# Patient Record
Sex: Female | Born: 1950
Health system: Southern US, Community
[De-identification: ages and names within clinical notes are randomized; demographics above are authoritative.]

## PROBLEM LIST (undated history)

## (undated) DIAGNOSIS — E11319 Type 2 diabetes mellitus with unspecified diabetic retinopathy without macular edema: Secondary | ICD-10-CM

## (undated) DIAGNOSIS — M1990 Unspecified osteoarthritis, unspecified site: Secondary | ICD-10-CM

## (undated) DIAGNOSIS — M719 Bursopathy, unspecified: Secondary | ICD-10-CM

## (undated) DIAGNOSIS — K219 Gastro-esophageal reflux disease without esophagitis: Secondary | ICD-10-CM

## (undated) DIAGNOSIS — J40 Bronchitis, not specified as acute or chronic: Secondary | ICD-10-CM

## (undated) DIAGNOSIS — I10 Essential (primary) hypertension: Secondary | ICD-10-CM

## (undated) HISTORY — PX: TOOTH EXTRACTION: SUR596

## (undated) HISTORY — PX: ABDOMINAL HYSTERECTOMY: SHX81

---

## 1999-08-28 ENCOUNTER — Emergency Department (HOSPITAL_COMMUNITY): Admission: EM | Admit: 1999-08-28 | Discharge: 1999-08-28 | Payer: Self-pay | Admitting: Emergency Medicine

## 1999-08-28 ENCOUNTER — Encounter: Payer: Self-pay | Admitting: Emergency Medicine

## 2000-08-09 ENCOUNTER — Emergency Department (HOSPITAL_COMMUNITY): Admission: EM | Admit: 2000-08-09 | Discharge: 2000-08-09 | Payer: Self-pay | Admitting: Emergency Medicine

## 2001-08-10 ENCOUNTER — Emergency Department (HOSPITAL_COMMUNITY): Admission: EM | Admit: 2001-08-10 | Discharge: 2001-08-10 | Payer: Self-pay | Admitting: Emergency Medicine

## 2001-08-10 ENCOUNTER — Encounter: Payer: Self-pay | Admitting: Emergency Medicine

## 2001-12-25 ENCOUNTER — Encounter: Payer: Self-pay | Admitting: Emergency Medicine

## 2001-12-25 ENCOUNTER — Emergency Department (HOSPITAL_COMMUNITY): Admission: EM | Admit: 2001-12-25 | Discharge: 2001-12-25 | Payer: Self-pay | Admitting: Emergency Medicine

## 2002-01-06 ENCOUNTER — Emergency Department (HOSPITAL_COMMUNITY): Admission: EM | Admit: 2002-01-06 | Discharge: 2002-01-06 | Payer: Self-pay | Admitting: *Deleted

## 2003-08-13 ENCOUNTER — Emergency Department (HOSPITAL_COMMUNITY): Admission: EM | Admit: 2003-08-13 | Discharge: 2003-08-13 | Payer: Self-pay | Admitting: *Deleted

## 2003-08-17 ENCOUNTER — Emergency Department (HOSPITAL_COMMUNITY): Admission: EM | Admit: 2003-08-17 | Discharge: 2003-08-17 | Payer: Self-pay | Admitting: Emergency Medicine

## 2005-05-18 ENCOUNTER — Emergency Department (HOSPITAL_COMMUNITY): Admission: EM | Admit: 2005-05-18 | Discharge: 2005-05-18 | Payer: Self-pay | Admitting: *Deleted

## 2005-10-26 ENCOUNTER — Emergency Department (HOSPITAL_COMMUNITY): Admission: EM | Admit: 2005-10-26 | Discharge: 2005-10-26 | Payer: Self-pay | Admitting: Emergency Medicine

## 2006-08-17 ENCOUNTER — Ambulatory Visit: Payer: Self-pay | Admitting: Family Medicine

## 2006-08-18 ENCOUNTER — Ambulatory Visit: Payer: Self-pay | Admitting: *Deleted

## 2006-08-30 ENCOUNTER — Emergency Department (HOSPITAL_COMMUNITY): Admission: EM | Admit: 2006-08-30 | Discharge: 2006-08-31 | Payer: Self-pay | Admitting: Emergency Medicine

## 2007-03-01 ENCOUNTER — Ambulatory Visit: Payer: Self-pay | Admitting: Family Medicine

## 2007-03-01 ENCOUNTER — Encounter (INDEPENDENT_AMBULATORY_CARE_PROVIDER_SITE_OTHER): Payer: Self-pay | Admitting: Family Medicine

## 2007-03-01 LAB — CONVERTED CEMR LAB: Urinalysis: ABNORMAL

## 2007-03-21 ENCOUNTER — Encounter (INDEPENDENT_AMBULATORY_CARE_PROVIDER_SITE_OTHER): Payer: Self-pay | Admitting: Family Medicine

## 2007-03-21 DIAGNOSIS — N951 Menopausal and female climacteric states: Secondary | ICD-10-CM

## 2007-03-21 DIAGNOSIS — J45909 Unspecified asthma, uncomplicated: Secondary | ICD-10-CM

## 2007-03-21 DIAGNOSIS — K219 Gastro-esophageal reflux disease without esophagitis: Secondary | ICD-10-CM

## 2007-03-30 ENCOUNTER — Encounter (INDEPENDENT_AMBULATORY_CARE_PROVIDER_SITE_OTHER): Payer: Self-pay | Admitting: *Deleted

## 2007-04-13 ENCOUNTER — Ambulatory Visit: Payer: Self-pay | Admitting: Family Medicine

## 2007-05-02 DIAGNOSIS — F17200 Nicotine dependence, unspecified, uncomplicated: Secondary | ICD-10-CM | POA: Insufficient documentation

## 2007-05-05 ENCOUNTER — Ambulatory Visit: Payer: Self-pay | Admitting: Family Medicine

## 2007-11-01 ENCOUNTER — Ambulatory Visit: Payer: Self-pay | Admitting: Internal Medicine

## 2008-02-04 ENCOUNTER — Emergency Department (HOSPITAL_COMMUNITY): Admission: EM | Admit: 2008-02-04 | Discharge: 2008-02-04 | Payer: Self-pay | Admitting: Emergency Medicine

## 2008-04-04 ENCOUNTER — Encounter (INDEPENDENT_AMBULATORY_CARE_PROVIDER_SITE_OTHER): Payer: Self-pay | Admitting: Family Medicine

## 2008-04-04 ENCOUNTER — Ambulatory Visit: Payer: Self-pay | Admitting: Internal Medicine

## 2008-04-04 LAB — CONVERTED CEMR LAB
ALT: 41 units/L — ABNORMAL HIGH (ref 0–35)
Alkaline Phosphatase: 65 units/L (ref 39–117)
Basophils Absolute: 0 10*3/uL (ref 0.0–0.1)
Basophils Relative: 0 % (ref 0–1)
CO2: 24 meq/L (ref 19–32)
Cholesterol: 151 mg/dL (ref 0–200)
Creatinine, Ser: 0.86 mg/dL (ref 0.40–1.20)
Eosinophils Absolute: 0.1 10*3/uL (ref 0.0–0.7)
Eosinophils Relative: 2 % (ref 0–5)
HCT: 41.8 % (ref 36.0–46.0)
Hemoglobin: 13.8 g/dL (ref 12.0–15.0)
Lymphocytes Relative: 38 % (ref 12–46)
MCHC: 33 g/dL (ref 30.0–36.0)
MCV: 83.8 fL (ref 78.0–100.0)
Microalb, Ur: 0.45 mg/dL (ref 0.00–1.89)
Monocytes Absolute: 0.6 10*3/uL (ref 0.1–1.0)
RDW: 17.5 % — ABNORMAL HIGH (ref 11.5–15.5)
Total Bilirubin: 0.4 mg/dL (ref 0.3–1.2)
Total CHOL/HDL Ratio: 2.5
Triglycerides: 73 mg/dL (ref <150)
VLDL: 15 mg/dL (ref 0–40)

## 2008-10-31 ENCOUNTER — Ambulatory Visit: Payer: Self-pay | Admitting: Family Medicine

## 2009-05-13 ENCOUNTER — Ambulatory Visit: Payer: Self-pay | Admitting: Internal Medicine

## 2009-05-14 ENCOUNTER — Encounter (INDEPENDENT_AMBULATORY_CARE_PROVIDER_SITE_OTHER): Payer: Self-pay | Admitting: Internal Medicine

## 2009-05-14 LAB — CONVERTED CEMR LAB
CO2: 25 meq/L (ref 19–32)
Cholesterol: 160 mg/dL (ref 0–200)
Creatinine, Ser: 0.96 mg/dL (ref 0.40–1.20)
Glucose, Bld: 96 mg/dL (ref 70–99)
Total Bilirubin: 0.5 mg/dL (ref 0.3–1.2)
Total CHOL/HDL Ratio: 2.7
Triglycerides: 104 mg/dL (ref <150)
VLDL: 21 mg/dL (ref 0–40)

## 2009-07-15 ENCOUNTER — Ambulatory Visit: Payer: Self-pay | Admitting: Internal Medicine

## 2009-07-15 ENCOUNTER — Telehealth (INDEPENDENT_AMBULATORY_CARE_PROVIDER_SITE_OTHER): Payer: Self-pay | Admitting: *Deleted

## 2010-08-12 NOTE — Progress Notes (Signed)
Summary: triage/cough congestion hx asthma and bronchitis  Phone Note Call from Patient   Reason for Call: Talk to Nurse Summary of Call: Patient states she has been sick since Christmas with coughing and congestion in chest..Marland KitchenDescribes as tightness. Cough is non productive. She has a history of Asthma and Bronchitis.Marland KitchenMarland KitchenShe is a smoker.Marland KitchenMarland KitchenShe also has a sore throat with "chills and sweat rolling off".  Patient has tried different OTC medications and nothing is helping... Initial call taken by: Conchita Paris,  July 15, 2009 9:06 AM

## 2010-09-09 ENCOUNTER — Emergency Department (HOSPITAL_COMMUNITY)
Admission: EM | Admit: 2010-09-09 | Discharge: 2010-09-09 | Disposition: A | Discharge status: Home / Self Care | Payer: Self-pay | Attending: Emergency Medicine | Admitting: Emergency Medicine

## 2010-09-09 ENCOUNTER — Emergency Department (HOSPITAL_COMMUNITY): Payer: Self-pay

## 2010-09-09 DIAGNOSIS — R197 Diarrhea, unspecified: Secondary | ICD-10-CM | POA: Insufficient documentation

## 2010-09-09 DIAGNOSIS — N390 Urinary tract infection, site not specified: Secondary | ICD-10-CM | POA: Insufficient documentation

## 2010-09-09 DIAGNOSIS — R109 Unspecified abdominal pain: Secondary | ICD-10-CM | POA: Insufficient documentation

## 2010-09-09 DIAGNOSIS — R112 Nausea with vomiting, unspecified: Secondary | ICD-10-CM | POA: Insufficient documentation

## 2010-09-09 DIAGNOSIS — R509 Fever, unspecified: Secondary | ICD-10-CM | POA: Insufficient documentation

## 2010-09-09 LAB — URINE MICROSCOPIC-ADD ON

## 2010-09-09 LAB — COMPREHENSIVE METABOLIC PANEL
ALT: 30 U/L (ref 0–35)
AST: 27 U/L (ref 0–37)
Albumin: 4.4 g/dL (ref 3.5–5.2)
Alkaline Phosphatase: 66 U/L (ref 39–117)
CO2: 29 mEq/L (ref 19–32)
Chloride: 103 mEq/L (ref 96–112)
Creatinine, Ser: 0.74 mg/dL (ref 0.4–1.2)
GFR calc Af Amer: >60 mL/min (ref >60)
GFR calc non Af Amer: >60 mL/min (ref >60)
Potassium: 4.1 mEq/L (ref 3.5–5.1)
Total Bilirubin: 0.8 mg/dL (ref 0.3–1.2)

## 2010-09-09 LAB — URINALYSIS, ROUTINE W REFLEX MICROSCOPIC
Hgb urine dipstick: NEGATIVE
Ketones, ur: NEGATIVE mg/dL
Protein, ur: NEGATIVE mg/dL
Urine Glucose, Fasting: NEGATIVE mg/dL

## 2010-09-09 LAB — DIFFERENTIAL
Basophils Absolute: 0 10*3/uL (ref 0.0–0.1)
Eosinophils Absolute: 0 10*3/uL (ref 0.0–0.7)
Eosinophils Relative: 1 % (ref 0–5)
Lymphocytes Relative: 25 % (ref 12–46)
Monocytes Absolute: 0.5 10*3/uL (ref 0.1–1.0)

## 2010-09-09 LAB — CBC
Hemoglobin: 14.9 g/dL (ref 12.0–15.0)
MCH: 27.2 pg (ref 26.0–34.0)
Platelets: 255 10*3/uL (ref 150–400)
RBC: 5.47 MIL/uL — ABNORMAL HIGH (ref 3.87–5.11)
WBC: 6.4 10*3/uL (ref 4.0–10.5)

## 2010-09-09 MED ORDER — IOHEXOL 300 MG/ML  SOLN
100.0000 mL | Freq: Once | INTRAMUSCULAR | Status: AC | PRN
  Administered 2010-09-09: 100 mL via INTRAVENOUS

## 2011-01-19 ENCOUNTER — Emergency Department (HOSPITAL_COMMUNITY): Payer: Self-pay

## 2011-01-19 ENCOUNTER — Emergency Department (HOSPITAL_COMMUNITY)
Admission: EM | Admit: 2011-01-19 | Discharge: 2011-01-19 | Disposition: A | Discharge status: Home / Self Care | Payer: Self-pay | Attending: Emergency Medicine | Admitting: Emergency Medicine

## 2011-01-19 DIAGNOSIS — M67919 Unspecified disorder of synovium and tendon, unspecified shoulder: Secondary | ICD-10-CM | POA: Insufficient documentation

## 2011-01-19 DIAGNOSIS — M25519 Pain in unspecified shoulder: Secondary | ICD-10-CM | POA: Insufficient documentation

## 2011-01-19 DIAGNOSIS — M719 Bursopathy, unspecified: Secondary | ICD-10-CM | POA: Insufficient documentation

## 2011-04-10 LAB — HEPATITIS C ANTIBODY, REFLEX: HCV Ab: NEGATIVE

## 2011-09-22 ENCOUNTER — Encounter (HOSPITAL_COMMUNITY): Payer: Self-pay | Admitting: Emergency Medicine

## 2011-09-22 ENCOUNTER — Emergency Department (INDEPENDENT_AMBULATORY_CARE_PROVIDER_SITE_OTHER)
Admission: EM | Admit: 2011-09-22 | Discharge: 2011-09-22 | Disposition: A | Discharge status: Home / Self Care | Payer: Self-pay | Source: Home / Self Care | Attending: Emergency Medicine | Admitting: Emergency Medicine

## 2011-09-22 DIAGNOSIS — J45909 Unspecified asthma, uncomplicated: Secondary | ICD-10-CM

## 2011-09-22 HISTORY — DX: Bronchitis, not specified as acute or chronic: J40

## 2011-09-22 HISTORY — DX: Unspecified osteoarthritis, unspecified site: M19.90

## 2011-09-22 HISTORY — DX: Bursopathy, unspecified: M71.9

## 2011-09-22 MED ORDER — IPRATROPIUM BROMIDE 0.02 % IN SOLN
0.5000 mg | Freq: Once | RESPIRATORY_TRACT | Status: AC
  Administered 2011-09-22: 0.5 mg via RESPIRATORY_TRACT

## 2011-09-22 MED ORDER — PREDNISONE 5 MG PO KIT: 1.0000 | PACK | Freq: Every day | ORAL | Status: DC

## 2011-09-22 MED ORDER — ALBUTEROL SULFATE (5 MG/ML) 0.5% IN NEBU
INHALATION_SOLUTION | RESPIRATORY_TRACT | Status: AC
  Filled 2011-09-22: qty 1

## 2011-09-22 MED ORDER — AMOXICILLIN 500 MG PO CAPS: 1000.0000 mg | ORAL_CAPSULE | Freq: Three times a day (TID) | ORAL | Status: AC

## 2011-09-22 MED ORDER — METHYLPREDNISOLONE SODIUM SUCC 125 MG IJ SOLR
125.0000 mg | Freq: Once | INTRAMUSCULAR | Status: AC
  Administered 2011-09-22: 125 mg via INTRAMUSCULAR

## 2011-09-22 MED ORDER — BENZONATATE 200 MG PO CAPS: 200.0000 mg | ORAL_CAPSULE | Freq: Three times a day (TID) | ORAL | Status: AC | PRN

## 2011-09-22 MED ORDER — METHYLPREDNISOLONE SODIUM SUCC 125 MG IJ SOLR
INTRAMUSCULAR | Status: AC
  Filled 2011-09-22: qty 2

## 2011-09-22 MED ORDER — ALBUTEROL SULFATE (5 MG/ML) 0.5% IN NEBU
5.0000 mg | INHALATION_SOLUTION | Freq: Once | RESPIRATORY_TRACT | Status: AC
  Administered 2011-09-22: 5 mg via RESPIRATORY_TRACT

## 2011-09-22 NOTE — ED Provider Notes (Signed)
Chief Complaint  Patient presents with  . URI    History of Present Illness:   Barbara Wilcox is a 61 year old female who has had a 6 day history of a dry cough, soreness in chest, wheezing, aching all over, feels weak, tired, and rundown, has felt chilled and has had sweats, and sore throat, nasal congestion, rhinorrhea, sneezing, and occasional nausea, vomiting, and diarrhea. She has a history of asthma and she takes when necessary Proventil for that. She is also cigarette smoker. [facility].  Review of Systems:  Other than noted above, the patient denies any of the following symptoms. Systemic:  No fever, chills, sweats, fatigue, myalgias, headache, or anorexia. Eye:  No redness, pain or drainage. ENT:  No earache, nasal congestion, rhinorrhea, sinus pressure, or sore throat. Lungs:  No cough, sputum production, wheezing, shortness of breath. Or chest pain. GI:  No nausea, vomiting, abdominal pain or diarrhea. Skin:  No rash or itching.  PMFSH:  Past medical history, family history, social history, meds, and allergies were reviewed.  Physical Exam:   Vital signs:  BP 142/77  Pulse 92  Temp(Src) 98.3 F (36.8 C) (Oral)  Resp 20  SpO2 100% General:  Alert, in no distress. Eye:  No conjunctival injection or drainage. ENT:  TMs and canals were normal, without erythema or inflammation.  Nasal mucosa was clear and uncongested, without drainage.  Mucous membranes were moist.  Pharynx was clear, without exudate or drainage.  There were no oral ulcerations or lesions. Neck:  Supple, no adenopathy, tenderness or mass. Lungs:  No respiratory distress.  She has bilateral inspiratory and expiratory wheezes and rhonchi, no rales.  Breath sounds were otherwise clear and equal bilaterally. Heart:  Regular rhythm, without gallops, murmers or rubs. Skin:  Clear, warm, and dry, without rash or lesions.  Course in Urgent Care Center:   She was given a DuoNeb  breathing treatment and Solu-Medrol 125 mg IM. Thereafter she felt considerably better. On auscultation her lungs were clear and wheeze free.   Assessment:   Diagnoses that have been ruled out:  None  Diagnoses that are still under consideration:  None  Final diagnoses:  Asthmatic bronchitis      Plan:   1.  The following meds were prescribed:   New Prescriptions   AMOXICILLIN (AMOXIL) 500 MG CAPSULE    Take 2 capsules (1,000 mg total) by mouth 3 (three) times daily.   BENZONATATE (TESSALON) 200 MG CAPSULE    Take 1 capsule (200 mg total) by mouth 3 (three) times daily as needed for cough.   PREDNISONE 5 MG KIT    Take 1 kit (5 mg total) by mouth daily after breakfast. Prednisone 5 mg 6 day dosepack.  Take as directed.   2.  The patient was instructed in symptomatic care and handouts were given. 3.  The patient was told to return if becoming worse in any way, if no better in 3 or 4 days, and given some red flag symptoms that would indicate earlier return.  She was strongly encouraged to quit smoking.   Reuben Likes, MD 09/22/11 [phone]

## 2011-09-22 NOTE — Discharge Instructions (Signed)

## 2011-09-22 NOTE — ED Notes (Signed)
Onset of symptoms on Thursday: sore throat, and feeling bad, getting worse every day.  Patient has a cough, hard cough, non productive.  Unknown fever.  Audible wheezing, patient is speaking in complete sentences, coughing frequently.  As patient coughing subsides, wheezing is less audible.  Also has runny nose, denies ear pain.  Right ear stuffy at times

## 2012-02-05 ENCOUNTER — Emergency Department (HOSPITAL_COMMUNITY): Payer: Self-pay

## 2012-02-05 ENCOUNTER — Emergency Department (HOSPITAL_COMMUNITY)
Admission: EM | Admit: 2012-02-05 | Discharge: 2012-02-05 | Disposition: A | Discharge status: Home / Self Care | Payer: Self-pay | Attending: Emergency Medicine | Admitting: Emergency Medicine

## 2012-02-05 ENCOUNTER — Encounter (HOSPITAL_COMMUNITY): Payer: Self-pay | Admitting: *Deleted

## 2012-02-05 DIAGNOSIS — N390 Urinary tract infection, site not specified: Secondary | ICD-10-CM | POA: Insufficient documentation

## 2012-02-05 DIAGNOSIS — J45909 Unspecified asthma, uncomplicated: Secondary | ICD-10-CM | POA: Insufficient documentation

## 2012-02-05 DIAGNOSIS — R109 Unspecified abdominal pain: Secondary | ICD-10-CM | POA: Insufficient documentation

## 2012-02-05 DIAGNOSIS — F17200 Nicotine dependence, unspecified, uncomplicated: Secondary | ICD-10-CM | POA: Insufficient documentation

## 2012-02-05 DIAGNOSIS — Z8739 Personal history of other diseases of the musculoskeletal system and connective tissue: Secondary | ICD-10-CM | POA: Insufficient documentation

## 2012-02-05 LAB — CBC WITH DIFFERENTIAL/PLATELET
Basophils Relative: 1 % (ref 0–1)
Eosinophils Absolute: 0.1 10*3/uL (ref 0.0–0.7)
Eosinophils Relative: 1 % (ref 0–5)
HCT: 43.9 % (ref 36.0–46.0)
Hemoglobin: 15.2 g/dL — ABNORMAL HIGH (ref 12.0–15.0)
Lymphs Abs: 3.4 10*3/uL (ref 0.7–4.0)
MCH: 27.9 pg (ref 26.0–34.0)
MCHC: 34.6 g/dL (ref 30.0–36.0)
MCV: 80.7 fL (ref 78.0–100.0)
Monocytes Absolute: 0.9 10*3/uL (ref 0.1–1.0)
Monocytes Relative: 10 % (ref 3–12)
RBC: 5.44 MIL/uL — ABNORMAL HIGH (ref 3.87–5.11)

## 2012-02-05 LAB — URINALYSIS, ROUTINE W REFLEX MICROSCOPIC
Bilirubin Urine: NEGATIVE
Ketones, ur: NEGATIVE mg/dL
Nitrite: NEGATIVE
Specific Gravity, Urine: 1.03 (ref 1.005–1.030)
Urobilinogen, UA: 0.2 mg/dL (ref 0.0–1.0)
pH: 5.5 (ref 5.0–8.0)

## 2012-02-05 LAB — COMPREHENSIVE METABOLIC PANEL
Albumin: 4.6 g/dL (ref 3.5–5.2)
Alkaline Phosphatase: 82 U/L (ref 39–117)
BUN: 15 mg/dL (ref 6–23)
Creatinine, Ser: 0.74 mg/dL (ref 0.50–1.10)
GFR calc Af Amer: >90 mL/min (ref >90)
Glucose, Bld: 116 mg/dL — ABNORMAL HIGH (ref 70–99)
Potassium: 3.8 mEq/L (ref 3.5–5.1)
Total Protein: 8.6 g/dL — ABNORMAL HIGH (ref 6.0–8.3)

## 2012-02-05 LAB — LIPASE, BLOOD: Lipase: 18 U/L (ref 11–59)

## 2012-02-05 MED ORDER — SODIUM CHLORIDE 0.9 % IV SOLN
INTRAVENOUS | Status: DC
  Administered 2012-02-05: 12:00:00 via INTRAVENOUS

## 2012-02-05 MED ORDER — OXYCODONE-ACETAMINOPHEN 5-325 MG PO TABS: 1.0000 | ORAL_TABLET | Freq: Four times a day (QID) | ORAL | Status: AC | PRN

## 2012-02-05 MED ORDER — SODIUM CHLORIDE 0.9 % IV BOLUS (SEPSIS)
1000.0000 mL | Freq: Once | INTRAVENOUS | Status: AC
  Administered 2012-02-05: 1000 mL via INTRAVENOUS

## 2012-02-05 MED ORDER — MORPHINE SULFATE 4 MG/ML IJ SOLN
4.0000 mg | Freq: Once | INTRAMUSCULAR | Status: AC
  Administered 2012-02-05: 4 mg via INTRAVENOUS
  Filled 2012-02-05: qty 1

## 2012-02-05 MED ORDER — IOHEXOL 300 MG/ML  SOLN
100.0000 mL | Freq: Once | INTRAMUSCULAR | Status: AC | PRN
  Administered 2012-02-05: 100 mL via INTRAVENOUS

## 2012-02-05 MED ORDER — ONDANSETRON HCL 4 MG/2ML IJ SOLN
4.0000 mg | Freq: Once | INTRAMUSCULAR | Status: AC
  Administered 2012-02-05: 4 mg via INTRAVENOUS
  Filled 2012-02-05: qty 2

## 2012-02-05 MED ORDER — ONDANSETRON 8 MG PO TBDP: ORAL_TABLET | ORAL | Status: AC

## 2012-02-05 MED ORDER — NITROFURANTOIN MONOHYD MACRO 100 MG PO CAPS: 100.0000 mg | ORAL_CAPSULE | Freq: Two times a day (BID) | ORAL | Status: AC

## 2012-02-05 NOTE — ED Provider Notes (Signed)
History     CSN: [id number]  Arrival date & time 02/05/12  1478   First MD Initiated Contact with Patient 02/05/12 [phone]      Chief Complaint  Patient presents with  . Abdominal Pain  . Nausea  . Emesis    (Consider location/radiation/quality/duration/timing/severity/associated sxs/prior treatment) HPI.... generalized abdominal pain for 2 weeks, worse in lower abdomen, left lower quadrant greater than right lower quadrant.  Nothing makes symptoms better or worse. Severity is mild. No radiation of pain.  Has had an abdominal hysterectomy.  No fever, chills, dysuria, vomiting, diarrhea, vaginal discharge or bleeding.  Past Medical History  Diagnosis Date  . Asthma   . Arthritis   . Bursitis   . Bronchitis     Past Surgical History  Procedure Date  . Abdominal hysterectomy     No family history on file.  History  Substance Use Topics  . Smoking status: Current Everyday Smoker  . Smokeless tobacco: Not on file  . Alcohol Use: Yes     occasionally    OB History    Grav Para Term Preterm Abortions TAB SAB Ect Mult Living                  Review of Systems  All other systems reviewed and are negative.    Allergies  Aspirin  Home Medications   Current Outpatient Rx  Name Route Sig Dispense Refill  . ALBUTEROL SULFATE HFA 108 (90 BASE) MCG/ACT IN AERS Inhalation Inhale 2 puffs into the lungs every 6 (six) hours as needed. For shortness of breath.    . CYCLOBENZAPRINE HCL 10 MG PO TABS Oral Take 10 mg by mouth 3 (three) times daily as needed. For pain.    . IBUPROFEN 800 MG PO TABS Oral Take 800 mg by mouth every 8 (eight) hours as needed. For pain.    Marland Kitchen TRAMADOL HCL 50 MG PO TABS Oral Take 50 mg by mouth every 6 (six) hours as needed. For pain.    Marland Kitchen NITROFURANTOIN MONOHYD MACRO 100 MG PO CAPS Oral Take 1 capsule (100 mg total) by mouth 2 (two) times daily. X 7 days 10 capsule 0  . ONDANSETRON 8 MG PO TBDP  8mg  ODT q4 hours prn nausea 8 tablet 0  .  OXYCODONE-ACETAMINOPHEN 5-325 MG PO TABS Oral Take 1-2 tablets by mouth every 6 (six) hours as needed for pain. 20 tablet 0    BP 176/80  Pulse 70  Temp 98.5 F (36.9 C) (Oral)  Resp 20  Ht 5\' 4"  (1.626 m)  Wt 160 lb (72.576 kg)  BMI 27.46 kg/m2  SpO2 99%  Physical Exam  Nursing note and vitals reviewed. Constitutional: She is oriented to person, place, and time. She appears well-developed and well-nourished.  HENT:  Head: Normocephalic and atraumatic.  Eyes: Conjunctivae and EOM are normal. Pupils are equal, round, and reactive to light.  Neck: Normal range of motion. Neck supple.  Cardiovascular: Normal rate and regular rhythm.   Pulmonary/Chest: Effort normal and breath sounds normal.  Abdominal: Soft. Bowel sounds are normal.       Minimal left lower quadrant tenderness.  Musculoskeletal: Normal range of motion.  Neurological: She is alert and oriented to person, place, and time.  Skin: Skin is warm and dry.  Psychiatric: She has a normal mood and affect.    ED Course  Procedures (including critical care time)  Labs Reviewed  CBC WITH DIFFERENTIAL - Abnormal; Notable for the following:  RBC 5.44 (*)     Hemoglobin 15.2 (*)     All other components within normal limits  COMPREHENSIVE METABOLIC PANEL - Abnormal; Notable for the following:    Glucose, Bld 116 (*)     Total Protein 8.6 (*)     All other components within normal limits  URINALYSIS, ROUTINE W REFLEX MICROSCOPIC - Abnormal; Notable for the following:    APPearance CLOUDY (*)     Hgb urine dipstick TRACE (*)     Leukocytes, UA SMALL (*)     All other components within normal limits  URINE MICROSCOPIC-ADD ON - Abnormal; Notable for the following:    Squamous Epithelial / LPF MANY (*)     Bacteria, UA MANY (*)     All other components within normal limits  LIPASE, BLOOD  URINE CULTURE   Ct Abdomen Pelvis W Contrast  02/05/2012  *RADIOLOGY REPORT*  Clinical Data: Abdominal pain, nausea and vomiting  for 2 weeks. Question diverticulitis.  CT ABDOMEN AND PELVIS WITH CONTRAST  Technique:  Multidetector CT imaging of the abdomen and pelvis was performed following the standard protocol during bolus administration of intravenous contrast.  Contrast: OMNIPAQUE IOHEXOL 300 MG/ML  SOLN  Comparison: CT abdomen and pelvis 09/09/2010.  Findings: The lung bases are clear.  No pleural or pericardial effusion.  A single calcification is identified in the left hepatic lobe.  The liver is otherwise unremarkable.  The gallbladder, spleen, adrenal glands, pancreas and left kidney appear normal.  Abnormal axis orientation of the right kidney is incidentally noted.  The patient is status post hysterectomy.  The stomach, small and large bowel and appendix appear normal.  No lymphadenopathy or fluid.  No focal bony abnormality is identified.  Facet degenerative disease at L5- S1, worse on the right noted.  IMPRESSION: No acute finding or finding to explain the patient's symptoms. Status post hysterectomy.  Original Report Authenticated By: Bernadene Bell. D'ALESSIO, M.D.     1. Abdominal pain   2. Urinary tract infection       MDM  We'll CT abdomen secondary longevity of symptoms and patient's age.  CT scan shows no acute findings. Patient feels better after IV hydration and pain management. Minor evidence of a urinary infection. Will treat for same. No acute abdomen at discharge        Donnetta Hutching, MD 02/07/12 [phone]

## 2012-02-05 NOTE — Progress Notes (Signed)
Pt confirms E amao is pcp at [facility] Pt viewed MD name on her Rx bottles. EPIC updated  Pt inquired if she could take her own pain medication CM instructed her not to take her medications but to allow ED RN assist her with pain medication if needed CM informed pt Cm would inform ED RN she was requesting pain medication

## 2012-02-05 NOTE — ED Notes (Signed)
Patient transported to CT 

## 2012-02-05 NOTE — ED Notes (Signed)
ZOX:[id number]<AV> Expected date:02/05/12<BR> Expected time: 9:01 AM<BR> Means of arrival:Ambulance<BR> Comments:<BR> 46yoM, Seizure, hx of

## 2012-02-05 NOTE — ED Notes (Signed)
Pt aware of the need for a urine sample. 

## 2012-02-05 NOTE — ED Notes (Signed)
Pt c/o generalized abd pain, n/v x2weeks.

## 2012-02-08 NOTE — ED Notes (Signed)
+  Urine. Patient given Macrobid. No sensitivities listed. Chart sent to EDP office for review. °

## 2012-02-09 NOTE — ED Notes (Signed)
Chart returned from EDP office . Allow patient to continue on Macrobid Diflucan 200 mg po daily x 14 days Pt must f/u with primary care per Arthor Captain PAC.

## 2012-02-09 NOTE — ED Notes (Signed)
Prescription called in to [facility] at [phone] for diflucan 200mg  po daily x14 days no refills per [facility].

## 2012-02-25 IMAGING — CR DG SHOULDER 2+V*L*
3 series · 3 of 3 positions shown · non-contrast
Comparison: None

CLINICAL DATA: Left shoulder pain.

LEFT SHOULDER - 2+ VIEW

[w shoulder ap internal left]
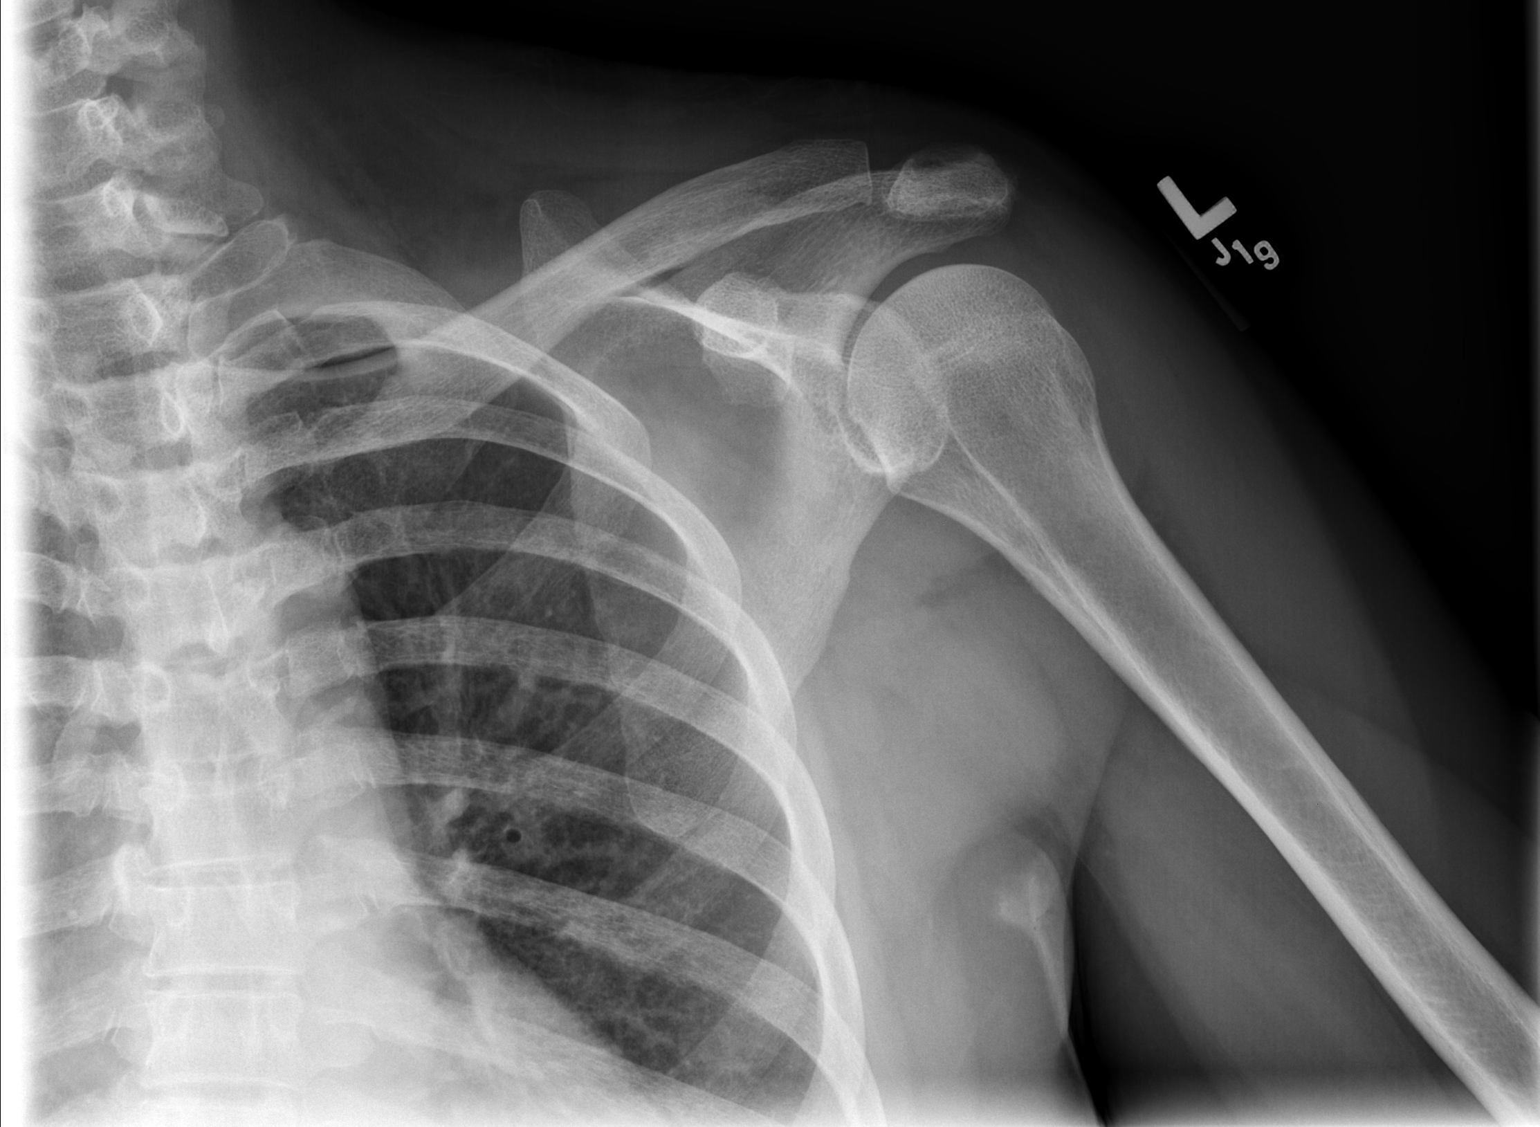

[w shoulder ap external left]
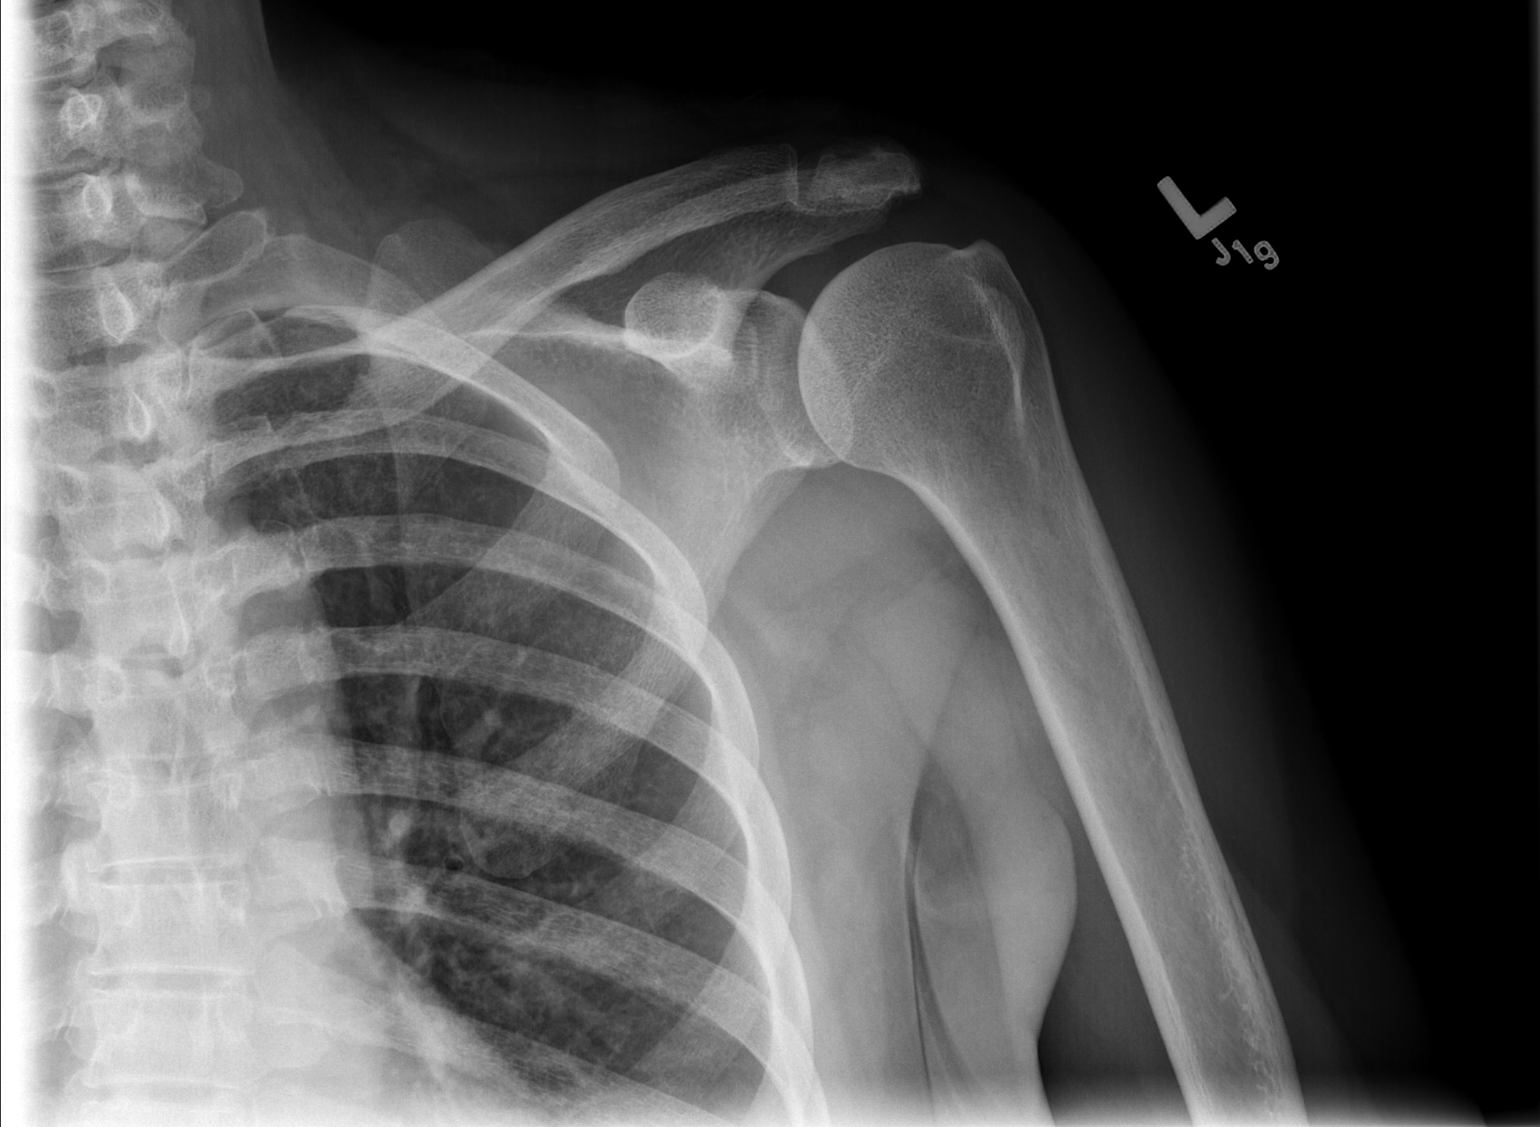

[w shoulder y view left]
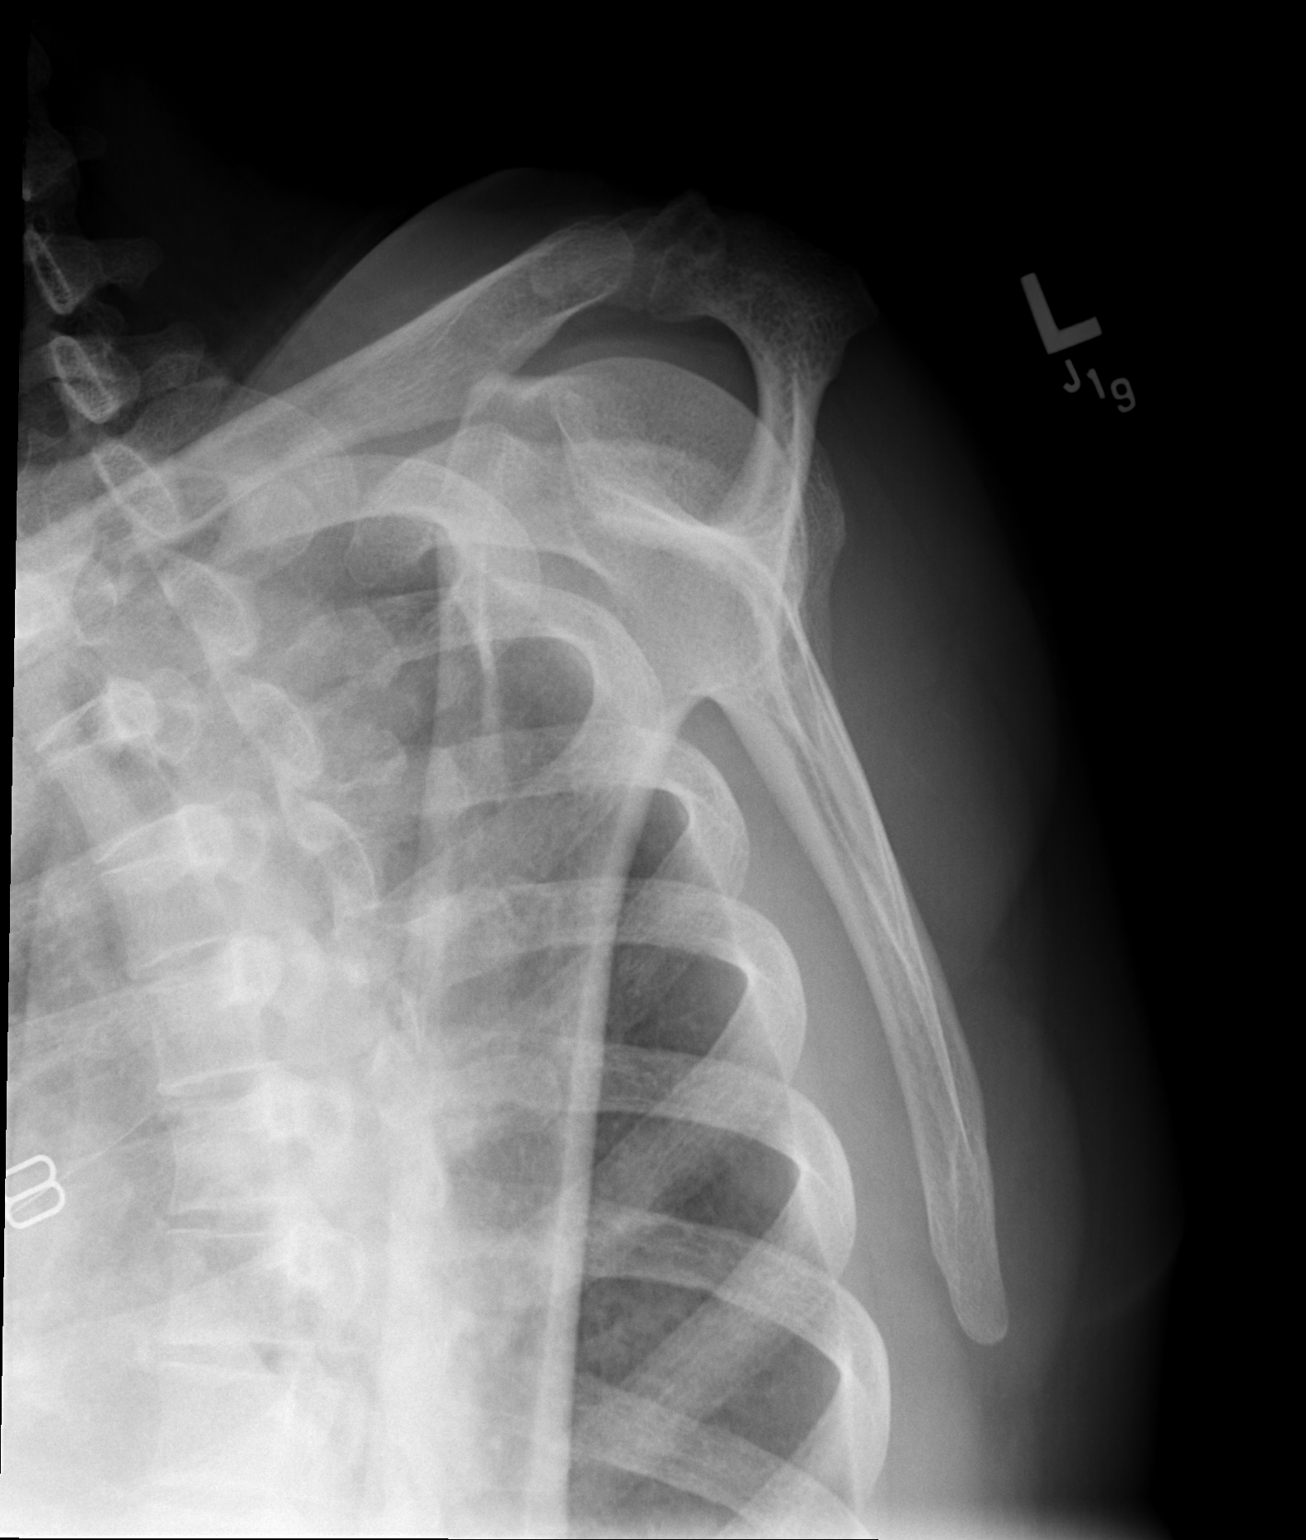

[3 of 3 positions shown; findings below may reference images not displayed]

FINDINGS: There is no evidence of fracture, subluxation or
dislocation.
Minimal degenerative changes of the glenohumeral joint and AC joint
are noted.
No focal bony lesions are present.
The visualized left hemithorax is unremarkable.
IMPRESSION: Minimal degenerative changes without other significant abnormality.

## 2012-12-19 ENCOUNTER — Emergency Department (HOSPITAL_COMMUNITY)
Admission: EM | Admit: 2012-12-19 | Discharge: 2012-12-19 | Disposition: A | Discharge status: Home / Self Care | Payer: Self-pay | Attending: Emergency Medicine | Admitting: Emergency Medicine

## 2012-12-19 ENCOUNTER — Encounter (HOSPITAL_COMMUNITY): Payer: Self-pay

## 2012-12-19 DIAGNOSIS — J45909 Unspecified asthma, uncomplicated: Secondary | ICD-10-CM | POA: Insufficient documentation

## 2012-12-19 DIAGNOSIS — Z8739 Personal history of other diseases of the musculoskeletal system and connective tissue: Secondary | ICD-10-CM | POA: Insufficient documentation

## 2012-12-19 DIAGNOSIS — F17200 Nicotine dependence, unspecified, uncomplicated: Secondary | ICD-10-CM | POA: Insufficient documentation

## 2012-12-19 DIAGNOSIS — J3489 Other specified disorders of nose and nasal sinuses: Secondary | ICD-10-CM | POA: Insufficient documentation

## 2012-12-19 DIAGNOSIS — R040 Epistaxis: Secondary | ICD-10-CM | POA: Insufficient documentation

## 2012-12-19 MED ORDER — ALBUTEROL SULFATE HFA 108 (90 BASE) MCG/ACT IN AERS
2.0000 | INHALATION_SPRAY | RESPIRATORY_TRACT | Status: DC | PRN
  Administered 2012-12-19: 2 via RESPIRATORY_TRACT
  Filled 2012-12-19: qty 6.7

## 2012-12-19 MED ORDER — WHITE PETROLATUM EX GEL: CUTANEOUS | Status: DC

## 2012-12-19 NOTE — ED Notes (Signed)
Pt with nose bleed off and on for 6 weeks.  Pt has had hx of same and given med at [facility] and she is out of those meds.  Has been using nasal decongestant med thinking it would help and has that med with her.  Pt c/o dryness in nose.  Nose bleed last night and some this am.  No bleeding at this time.

## 2012-12-19 NOTE — ED Provider Notes (Signed)
History     CSN: [id number]  Arrival date & time 12/19/12  4098   First MD Initiated Contact with Patient 12/19/12 [phone]      Chief Complaint  Patient presents with  . Epistaxis    (Consider location/radiation/quality/duration/timing/severity/associated sxs/prior treatment) HPI This is a 62 year old female who has had intermittent right-sided epistaxis for about the last 6 weeks. She has oxymetazoline nasal spray which she's used in the past but has avoided using it recently because she thought it was drying her nose out and making her symptoms worse. She states her nosebleeds almost every day. The bleeding is not severe. She is not having any bleeding at the present time. She is also an asthma patient but was recently terminated by [facility] and has not yet established with the [facility]. She is requesting an asthma inhaler in the meantime.  Past Medical History  Diagnosis Date  . Asthma   . Arthritis   . Bursitis   . Bronchitis     Past Surgical History  Procedure Laterality Date  . Abdominal hysterectomy      History reviewed. No pertinent family history.  History  Substance Use Topics  . Smoking status: Current Every Day Smoker  . Smokeless tobacco: Not on file  . Alcohol Use: Yes     Comment: occasionally    OB History   Grav Para Term Preterm Abortions TAB SAB Ect Mult Living                  Review of Systems  All other systems reviewed and are negative.    Allergies  Aspirin  Home Medications   Current Outpatient Rx  Name  Route  Sig  Dispense  Refill  . ibuprofen (ADVIL,MOTRIN) 800 MG tablet   Oral   Take 800 mg by mouth every 8 (eight) hours as needed. For pain.         Rocco Pauls (VISINE ADVANCED RELIEF) 0.05-0.1-1-1 % SOLN   Ophthalmic   Apply 1 drop to eye as needed (dry eyes.).         Marland Kitchen albuterol (PROVENTIL HFA;VENTOLIN HFA) 108 (90 BASE) MCG/ACT inhaler   Inhalation   Inhale 2 puffs into  the lungs every 6 (six) hours as needed. For shortness of breath.           BP 147/85  Pulse 72  Temp(Src) 98.6 F (37 C) (Oral)  Resp 70  SpO2 100%  Physical Exam General: Well-developed, well-nourished female in no acute distress; appearance consistent with age of record HENT: normocephalic, atraumatic; no epistaxis; normal appearing oropharynx Eyes: pupils equal round and reactive to light; extraocular muscles intact Neck: supple Heart: regular rate and rhythm Lungs: clear to auscultation bilaterally; dry cough Abdomen: soft; nondistended Extremities: No deformity; full range of motion Neurologic: Awake, alert and oriented; motor function intact in all extremities and symmetric; no facial droop Skin: Warm and dry Psychiatric: Normal mood and affect    ED Course  Procedures (including critical care time)     MDM  The patient was advised that the oxymetazoline spray is appropriate for acute bleeding but should be dripped into her nose run of them sprayed as spraying and is sniffing and dislodge clots a portion the epistaxis. For her dry nose she was advised to apply petroleum jelly with a cotton swab daily. She was advised to do this gently to avoid exacerbating her epistaxis.        Hanley Seamen, MD  12/19/12 0646 

## 2013-02-06 ENCOUNTER — Emergency Department (HOSPITAL_COMMUNITY): Payer: Self-pay

## 2013-02-06 ENCOUNTER — Encounter (HOSPITAL_COMMUNITY): Payer: Self-pay | Admitting: Radiology

## 2013-02-06 ENCOUNTER — Emergency Department (HOSPITAL_COMMUNITY)
Admission: EM | Admit: 2013-02-06 | Discharge: 2013-02-06 | Disposition: A | Discharge status: Home / Self Care | Payer: Self-pay | Attending: Emergency Medicine | Admitting: Emergency Medicine

## 2013-02-06 DIAGNOSIS — Z8709 Personal history of other diseases of the respiratory system: Secondary | ICD-10-CM | POA: Insufficient documentation

## 2013-02-06 DIAGNOSIS — R1032 Left lower quadrant pain: Secondary | ICD-10-CM | POA: Insufficient documentation

## 2013-02-06 DIAGNOSIS — Z8739 Personal history of other diseases of the musculoskeletal system and connective tissue: Secondary | ICD-10-CM | POA: Insufficient documentation

## 2013-02-06 DIAGNOSIS — R112 Nausea with vomiting, unspecified: Secondary | ICD-10-CM | POA: Insufficient documentation

## 2013-02-06 DIAGNOSIS — M1990 Unspecified osteoarthritis, unspecified site: Secondary | ICD-10-CM | POA: Insufficient documentation

## 2013-02-06 DIAGNOSIS — J45909 Unspecified asthma, uncomplicated: Secondary | ICD-10-CM | POA: Insufficient documentation

## 2013-02-06 DIAGNOSIS — Z79899 Other long term (current) drug therapy: Secondary | ICD-10-CM | POA: Insufficient documentation

## 2013-02-06 DIAGNOSIS — F17200 Nicotine dependence, unspecified, uncomplicated: Secondary | ICD-10-CM | POA: Insufficient documentation

## 2013-02-06 DIAGNOSIS — R109 Unspecified abdominal pain: Secondary | ICD-10-CM

## 2013-02-06 LAB — URINALYSIS, ROUTINE W REFLEX MICROSCOPIC
Bilirubin Urine: NEGATIVE
Glucose, UA: NEGATIVE mg/dL
Ketones, ur: NEGATIVE mg/dL
Leukocytes, UA: NEGATIVE
Nitrite: NEGATIVE
Protein, ur: NEGATIVE mg/dL
Specific Gravity, Urine: 1.019 (ref 1.005–1.030)
Urobilinogen, UA: 0.2 mg/dL (ref 0.0–1.0)
pH: 6 (ref 5.0–8.0)

## 2013-02-06 LAB — URINE MICROSCOPIC-ADD ON

## 2013-02-06 LAB — CBC WITH DIFFERENTIAL/PLATELET
Eosinophils Relative: 2 % (ref 0–5)
HCT: 43.4 % (ref 36.0–46.0)
Hemoglobin: 14 g/dL (ref 12.0–15.0)
Lymphocytes Relative: 47 % — ABNORMAL HIGH (ref 12–46)
Lymphs Abs: 3.8 10*3/uL (ref 0.7–4.0)
MCV: 84.3 fL (ref 78.0–100.0)
Monocytes Absolute: 0.8 10*3/uL (ref 0.1–1.0)
Monocytes Relative: 9 % (ref 3–12)
Platelets: 259 10*3/uL (ref 150–400)
RBC: 5.15 MIL/uL — ABNORMAL HIGH (ref 3.87–5.11)
WBC: 8.1 10*3/uL (ref 4.0–10.5)

## 2013-02-06 LAB — LIPASE, BLOOD: Lipase: 23 U/L (ref 11–59)

## 2013-02-06 LAB — HEPATIC FUNCTION PANEL
ALT: 33 U/L (ref 0–35)
Total Protein: 7.9 g/dL (ref 6.0–8.3)

## 2013-02-06 LAB — BASIC METABOLIC PANEL
CO2: 27 mEq/L (ref 19–32)
Chloride: 104 mEq/L (ref 96–112)
Creatinine, Ser: 0.71 mg/dL (ref 0.50–1.10)

## 2013-02-06 MED ORDER — ONDANSETRON HCL 4 MG PO TABS: 4.0000 mg | ORAL_TABLET | Freq: Four times a day (QID) | ORAL | Status: DC

## 2013-02-06 MED ORDER — IOHEXOL 300 MG/ML  SOLN
100.0000 mL | Freq: Once | INTRAMUSCULAR | Status: AC | PRN
  Administered 2013-02-06: 100 mL via INTRAVENOUS

## 2013-02-06 MED ORDER — MORPHINE SULFATE 4 MG/ML IJ SOLN
4.0000 mg | Freq: Once | INTRAMUSCULAR | Status: AC
  Administered 2013-02-06: 4 mg via INTRAVENOUS
  Filled 2013-02-06: qty 1

## 2013-02-06 MED ORDER — DOCUSATE SODIUM 100 MG PO CAPS: 100.0000 mg | ORAL_CAPSULE | Freq: Two times a day (BID) | ORAL | Status: DC | PRN

## 2013-02-06 MED ORDER — ONDANSETRON HCL 4 MG/2ML IJ SOLN
4.0000 mg | Freq: Once | INTRAMUSCULAR | Status: AC
  Administered 2013-02-06: 4 mg via INTRAVENOUS
  Filled 2013-02-06: qty 2

## 2013-02-06 MED ORDER — TRAMADOL HCL 50 MG PO TABS: 50.0000 mg | ORAL_TABLET | Freq: Four times a day (QID) | ORAL | Status: DC | PRN

## 2013-02-06 NOTE — ED Provider Notes (Signed)
CSN: [id number]     Arrival date & time 02/06/13  0845 History     First MD Initiated Contact with Patient 02/06/13 [phone]     Chief Complaint  Patient presents with  . Abdominal Pain   (Consider location/radiation/quality/duration/timing/severity/associated sxs/prior Treatment) HPI Comments: Patient with abdominal pain, N/V, hard stools x 7 days.  Abdominal pain was diffuse, now localized to LLQ, pain is constant, worse with palpation.  Hx acid reflux, has been taking pepcid AC and zantac without relief.  Emesis is clear.  Nonbloody, nonbilious.  Stools are hard and brown.  Denies rectal bleeding.  Pt is passing flatus.  Denies fevers, chills, body aches, CP, SOB, cough, dysuria, urinary frequency or urgency, abnormal vaginal discharge or bleeding.  Past abdominal surgery: hysterectomy remotely.    Patient is a 62 y.o. female presenting with abdominal pain. The history is provided by the patient.  Abdominal Pain Associated symptoms include abdominal pain, nausea and vomiting. Pertinent negatives include no chest pain, chills, coughing, fever or myalgias.    Past Medical History  Diagnosis Date  . Asthma   . Arthritis   . Bursitis   . Bronchitis    Past Surgical History  Procedure Laterality Date  . Abdominal hysterectomy     No family history on file. History  Substance Use Topics  . Smoking status: Current Every Day Smoker  . Smokeless tobacco: Not on file  . Alcohol Use: Yes     Comment: occasionally   OB History   Grav Para Term Preterm Abortions TAB SAB Ect Mult Living                 Review of Systems  Constitutional: Negative for fever and chills.  Respiratory: Negative for cough and shortness of breath.   Cardiovascular: Negative for chest pain.  Gastrointestinal: Positive for nausea, vomiting, abdominal pain and constipation. Negative for diarrhea and blood in stool.  Genitourinary: Negative for dysuria, urgency, frequency, vaginal bleeding and vaginal discharge.   Musculoskeletal: Negative for myalgias.    Allergies  Aspirin  Home Medications   Current Outpatient Rx  Name  Route  Sig  Dispense  Refill  . albuterol (PROVENTIL HFA;VENTOLIN HFA) 108 (90 BASE) MCG/ACT inhaler   Inhalation   Inhale 2 puffs into the lungs every 6 (six) hours as needed. For shortness of breath.         . famotidine (PEPCID AC) 10 MG chewable tablet   Oral   Chew 10 mg by mouth 2 (two) times daily.         Marland Kitchen ibuprofen (ADVIL,MOTRIN) 800 MG tablet   Oral   Take 800 mg by mouth every 8 (eight) hours as needed. For pain.         Rocco Pauls (VISINE ADVANCED RELIEF) 0.05-0.1-1-1 % SOLN   Ophthalmic   Apply 1 drop to eye as needed (dry eyes.).         Marland Kitchen white petrolatum ointment      Apply gently with cotton swab to inside of nostrils daily as needed for nasal dryness.         . docusate sodium (COLACE) 100 MG capsule   Oral   Take 1 capsule (100 mg total) by mouth 2 (two) times daily as needed for constipation.   20 capsule   0     Dispense as written.   . ondansetron (ZOFRAN) 4 MG tablet   Oral   Take 1 tablet (4 mg total) by mouth every 6 (six)  hours.   12 tablet   0   . traMADol (ULTRAM) 50 MG tablet   Oral   Take 1 tablet (50 mg total) by mouth every 6 (six) hours as needed for pain.   10 tablet   0    BP 146/75  Pulse 65  Temp(Src) 97.8 F (36.6 C) (Oral)  Resp 16  SpO2 100% Physical Exam  Nursing note and vitals reviewed. Constitutional: She appears well-developed and well-nourished. No distress.  HENT:  Head: Normocephalic and atraumatic.  Neck: Neck supple.  Cardiovascular: Normal rate and regular rhythm.   Pulmonary/Chest: Effort normal and breath sounds normal. No respiratory distress. She has no wheezes. She has no rales.  Abdominal: Soft. Bowel sounds are normal. She exhibits no distension and no mass. There is tenderness in the epigastric area, left upper quadrant and left lower quadrant. There  is CVA tenderness. There is no rebound and no guarding.  Musculoskeletal: She exhibits no edema.  Neurological: She is alert.  Skin: She is not diaphoretic.    ED Course   Procedures (including critical care time)  Labs Reviewed  CBC WITH DIFFERENTIAL - Abnormal; Notable for the following:    RBC 5.15 (*)    RDW 15.9 (*)    Neutrophils Relative % 41 (*)    Lymphocytes Relative 47 (*)    All other components within normal limits  URINALYSIS, ROUTINE W REFLEX MICROSCOPIC - Abnormal; Notable for the following:    Hgb urine dipstick TRACE (*)    All other components within normal limits  BASIC METABOLIC PANEL  URINE MICROSCOPIC-ADD ON  LIPASE, BLOOD  HEPATIC FUNCTION PANEL   Ct Abdomen Pelvis W Contrast  02/06/2013   *RADIOLOGY REPORT*  Clinical Data: Abdominal pain, left lower quadrant pain  CT ABDOMEN AND PELVIS WITH CONTRAST  Technique:  Multidetector CT imaging of the abdomen and pelvis was performed following the standard protocol during bolus administration of intravenous contrast.  Contrast: OMNIPAQUE IOHEXOL 300 MG/ML  SOLN  Comparison: 02/05/2012  Findings: Sagittal images of the spine shows mild degenerative changes lumbar spine.  Lung bases are unremarkable.  The liver, pancreas, spleen and adrenal glands are unremarkable.  Kidneys are symmetrical in size and enhancement.  No hydronephrosis or hydroureter.  Delayed renal images shows bilateral renal symmetrical excretion.  Mild dilatation of the right extrarenal collecting system without evidence of obstruction.  No small bowel obstruction.  No aortic aneurysm.  No ascites or free air.  No adenopathy.  Moderate stool noted in the sigmoid colon and distal sigmoid colon. The patient is status post hysterectomy.  No evidence of colitis or diverticulitis.  The urinary bladder is unremarkable.  A  normal appendix is clearly visualized axial image 38. Mild tortuous distal sigmoid colon.  IMPRESSION:  1.  No acute inflammatory process  within abdomen or pelvis. 2.  Moderate stool noted in distal sigmoid colon.  No evidence of colonic obstruction.  No colitis or diverticulitis. 3.  No hydronephrosis or hydroureter. 4.  Normal appendix.  No pericecal inflammation. 5.  Status post hysterectomy.   Original Report Authenticated By: Natasha Mead, M.D.   Dg Abd Acute W/chest  02/06/2013   *RADIOLOGY REPORT*  Clinical Data: Abdominal pain, nausea and vomiting, and constipation.  ACUTE ABDOMEN SERIES (ABDOMEN 2 VIEW & CHEST 1 VIEW)  Comparison: Multiple priors  Findings: Cardiomediastinal silhouette contours are within normal limits.  No focal consolidation.  No pleural effusions or pneumothorax.  There is a nonspecific bowel gas pattern.  No air fluid levels or dilated loops of bowel. Stool is identified in the rectum and descending colon.  No pneumoperitoneum.   No acute osseous abnormality  IMPRESSION: 1. Normal chest. 2. No evidence of bowel obstruction or pneumoperitoneum.   Original Report Authenticated By: Jerene Dilling, M.D.   1. Abdominal pain   2. Nausea and vomiting     MDM  Pt with one week of abdominal pain, worst in LLQ, with associated clear emesis and hard stools.  CT abd/pelvis shows no acute changes, labs unremarkable, pt feeling better with medications.  Pt is able to tolerate PO.  Likely constipation contributing to patient's LLQ discomfort.  D/C home with stool softener.  Discussed all results with patient.  Pt given return precautions.  Pt verbalizes understanding and agrees with plan.       Trixie Dredge, PA-C 02/06/13 1600

## 2013-02-06 NOTE — Progress Notes (Signed)
[facility] provided pt with a list of primary care resources. Asked patient if she had ever heard of the [facility] [employer], she stated yes and had an appt on Aug 18th at [facility] at [place] to get the [employer].

## 2013-02-06 NOTE — ED Notes (Signed)
Pt states that she has abd pain feels like a burning sensation to lt flank, n/v for the past 7 days now.

## 2013-02-09 NOTE — ED Provider Notes (Signed)
Medical screening examination/treatment/procedure(s) were performed by non-physician practitioner and as supervising physician I was immediately available for consultation/collaboration.  Candyce Churn, MD 02/09/13 [phone]

## 2013-05-25 ENCOUNTER — Other Ambulatory Visit (HOSPITAL_COMMUNITY): Payer: Self-pay | Admitting: Family Medicine

## 2013-05-25 ENCOUNTER — Ambulatory Visit (HOSPITAL_COMMUNITY)
Admission: RE | Admit: 2013-05-25 | Discharge: 2013-05-25 | Disposition: A | Discharge status: Home / Self Care | Payer: Disability Insurance | Source: Ambulatory Visit | Attending: Family Medicine | Admitting: Family Medicine

## 2013-05-25 DIAGNOSIS — M25552 Pain in left hip: Secondary | ICD-10-CM

## 2013-05-25 DIAGNOSIS — M25512 Pain in left shoulder: Secondary | ICD-10-CM

## 2013-05-25 DIAGNOSIS — M25561 Pain in right knee: Secondary | ICD-10-CM

## 2013-05-25 DIAGNOSIS — M25569 Pain in unspecified knee: Secondary | ICD-10-CM | POA: Insufficient documentation

## 2013-05-25 DIAGNOSIS — M25519 Pain in unspecified shoulder: Secondary | ICD-10-CM | POA: Insufficient documentation

## 2013-05-25 DIAGNOSIS — M25559 Pain in unspecified hip: Secondary | ICD-10-CM | POA: Insufficient documentation

## 2014-02-22 ENCOUNTER — Other Ambulatory Visit: Payer: Self-pay | Admitting: *Deleted

## 2014-12-12 ENCOUNTER — Inpatient Hospital Stay (HOSPITAL_COMMUNITY)
Admission: EM | Admit: 2014-12-12 | Discharge: 2014-12-12 | DRG: 641 | Disposition: A | Discharge status: Home / Self Care | Payer: Medicare Other | Attending: Interventional Radiology | Admitting: Interventional Radiology

## 2014-12-12 ENCOUNTER — Encounter (HOSPITAL_COMMUNITY): Payer: Self-pay | Admitting: Emergency Medicine

## 2014-12-12 ENCOUNTER — Emergency Department (HOSPITAL_COMMUNITY): Payer: Medicare Other

## 2014-12-12 DIAGNOSIS — J45909 Unspecified asthma, uncomplicated: Secondary | ICD-10-CM | POA: Diagnosis present

## 2014-12-12 DIAGNOSIS — Z79899 Other long term (current) drug therapy: Secondary | ICD-10-CM | POA: Diagnosis not present

## 2014-12-12 DIAGNOSIS — E860 Dehydration: Secondary | ICD-10-CM | POA: Diagnosis present

## 2014-12-12 DIAGNOSIS — R040 Epistaxis: Secondary | ICD-10-CM | POA: Diagnosis present

## 2014-12-12 DIAGNOSIS — R112 Nausea with vomiting, unspecified: Secondary | ICD-10-CM

## 2014-12-12 DIAGNOSIS — F17210 Nicotine dependence, cigarettes, uncomplicated: Secondary | ICD-10-CM | POA: Diagnosis present

## 2014-12-12 DIAGNOSIS — M1990 Unspecified osteoarthritis, unspecified site: Secondary | ICD-10-CM | POA: Diagnosis present

## 2014-12-12 DIAGNOSIS — K5900 Constipation, unspecified: Secondary | ICD-10-CM | POA: Diagnosis present

## 2014-12-12 DIAGNOSIS — E119 Type 2 diabetes mellitus without complications: Secondary | ICD-10-CM | POA: Diagnosis present

## 2014-12-12 DIAGNOSIS — K219 Gastro-esophageal reflux disease without esophagitis: Secondary | ICD-10-CM | POA: Diagnosis present

## 2014-12-12 DIAGNOSIS — R1084 Generalized abdominal pain: Secondary | ICD-10-CM

## 2014-12-12 DIAGNOSIS — I10 Essential (primary) hypertension: Secondary | ICD-10-CM | POA: Diagnosis present

## 2014-12-12 HISTORY — DX: Essential (primary) hypertension: I10

## 2014-12-12 HISTORY — DX: Gastro-esophageal reflux disease without esophagitis: K21.9

## 2014-12-12 LAB — CBC WITH DIFFERENTIAL/PLATELET
Basophils Absolute: 0 10*3/uL (ref 0.0–0.1)
Basophils Relative: 0 % (ref 0–1)
Eosinophils Absolute: 0.1 10*3/uL (ref 0.0–0.7)
Eosinophils Relative: 1 % (ref 0–5)
HCT: 38.9 % (ref 36.0–46.0)
Hemoglobin: 12.6 g/dL (ref 12.0–15.0)
Lymphocytes Relative: 43 % (ref 12–46)
Lymphs Abs: 4.5 10*3/uL — ABNORMAL HIGH (ref 0.7–4.0)
MCH: 26.9 pg (ref 26.0–34.0)
MCHC: 32.4 g/dL (ref 30.0–36.0)
MCV: 83.1 fL (ref 78.0–100.0)
Monocytes Absolute: 0.9 10*3/uL (ref 0.1–1.0)
Monocytes Relative: 9 % (ref 3–12)
Neutro Abs: 5 10*3/uL (ref 1.7–7.7)
Neutrophils Relative %: 47 % (ref 43–77)
Platelets: 308 10*3/uL (ref 150–400)
RBC: 4.68 MIL/uL (ref 3.87–5.11)
RDW: 15.6 % — ABNORMAL HIGH (ref 11.5–15.5)
WBC: 10.5 10*3/uL (ref 4.0–10.5)

## 2014-12-12 LAB — URINALYSIS, ROUTINE W REFLEX MICROSCOPIC
Bilirubin Urine: NEGATIVE
Glucose, UA: NEGATIVE mg/dL
Ketones, ur: NEGATIVE mg/dL
Leukocytes, UA: NEGATIVE
Nitrite: NEGATIVE
Protein, ur: NEGATIVE mg/dL
Specific Gravity, Urine: 1.016 (ref 1.005–1.030)
Urobilinogen, UA: 0.2 mg/dL (ref 0.0–1.0)
pH: 5.5 (ref 5.0–8.0)

## 2014-12-12 LAB — URINE MICROSCOPIC-ADD ON

## 2014-12-12 LAB — COMPREHENSIVE METABOLIC PANEL
ALT: 21 U/L (ref 14–54)
AST: 18 U/L (ref 15–41)
Albumin: 4.4 g/dL (ref 3.5–5.0)
Alkaline Phosphatase: 61 U/L (ref 38–126)
Anion gap: 9 (ref 5–15)
BUN: 30 mg/dL — ABNORMAL HIGH (ref 6–20)
CO2: 29 mmol/L (ref 22–32)
Calcium: 10.5 mg/dL — ABNORMAL HIGH (ref 8.9–10.3)
Chloride: 99 mmol/L — ABNORMAL LOW (ref 101–111)
Creatinine, Ser: 1.14 mg/dL — ABNORMAL HIGH (ref 0.44–1.00)
GFR calc Af Amer: 58 mL/min — ABNORMAL LOW (ref >60)
GFR calc non Af Amer: 50 mL/min — ABNORMAL LOW (ref >60)
Glucose, Bld: 116 mg/dL — ABNORMAL HIGH (ref 65–99)
Potassium: 3.7 mmol/L (ref 3.5–5.1)
Sodium: 137 mmol/L (ref 135–145)
Total Bilirubin: 0.7 mg/dL (ref 0.3–1.2)
Total Protein: 8.3 g/dL — ABNORMAL HIGH (ref 6.5–8.1)

## 2014-12-12 LAB — LIPASE, BLOOD: Lipase: 21 U/L — ABNORMAL LOW (ref 22–51)

## 2014-12-12 LAB — CBG MONITORING, ED: Glucose-Capillary: 109 mg/dL — ABNORMAL HIGH (ref 65–99)

## 2014-12-12 MED ORDER — PROMETHAZINE HCL 25 MG PO TABS: 25.0000 mg | ORAL_TABLET | Freq: Four times a day (QID) | ORAL | Status: DC | PRN

## 2014-12-12 MED ORDER — ONDANSETRON HCL 4 MG/2ML IJ SOLN
4.0000 mg | Freq: Once | INTRAMUSCULAR | Status: AC
  Administered 2014-12-12: 4 mg via INTRAVENOUS
  Filled 2014-12-12: qty 2

## 2014-12-12 MED ORDER — SODIUM CHLORIDE 0.9 % IJ SOLN: 3.0000 mL | INTRAMUSCULAR | Status: DC | PRN

## 2014-12-12 MED ORDER — SODIUM CHLORIDE 0.9 % IV SOLN: 250.0000 mL | INTRAVENOUS | Status: DC

## 2014-12-12 MED ORDER — IOHEXOL 300 MG/ML  SOLN
50.0000 mL | Freq: Once | INTRAMUSCULAR | Status: AC | PRN
  Administered 2014-12-12: 50 mL via ORAL

## 2014-12-12 MED ORDER — SODIUM CHLORIDE 0.9 % IJ SOLN: 3.0000 mL | Freq: Two times a day (BID) | INTRAMUSCULAR | Status: DC

## 2014-12-12 MED ORDER — MORPHINE SULFATE 4 MG/ML IJ SOLN
4.0000 mg | Freq: Once | INTRAMUSCULAR | Status: AC
  Administered 2014-12-12: 4 mg via INTRAVENOUS
  Filled 2014-12-12: qty 1

## 2014-12-12 MED ORDER — IOHEXOL 300 MG/ML  SOLN
100.0000 mL | Freq: Once | INTRAMUSCULAR | Status: AC | PRN
  Administered 2014-12-12: 100 mL via INTRAVENOUS

## 2014-12-12 NOTE — Discharge Instructions (Signed)
Phenergan as needed for nausea. Follow up with primary care doctor for recheck. Return if worsening symptoms.    Abdominal Pain, Women Abdominal (stomach, pelvic, or belly) pain can be caused by many things. It is important to tell your doctor:  The location of the pain.  Does it come and go or is it present all the time?  Are there things that start the pain (eating certain foods, exercise)?  Are there other symptoms associated with the pain (fever, nausea, vomiting, diarrhea)? All of this is helpful to know when trying to find the cause of the pain. CAUSES   Stomach: virus or bacteria infection, or ulcer.  Intestine: appendicitis (inflamed appendix), regional ileitis (Crohn's disease), ulcerative colitis (inflamed colon), irritable bowel syndrome, diverticulitis (inflamed diverticulum of the colon), or cancer of the stomach or intestine.  Gallbladder disease or stones in the gallbladder.  Kidney disease, kidney stones, or infection.  Pancreas infection or cancer.  Fibromyalgia (pain disorder).  Diseases of the female organs:  Uterus: fibroid (non-cancerous) tumors or infection.  Fallopian tubes: infection or tubal pregnancy.  Ovary: cysts or tumors.  Pelvic adhesions (scar tissue).  Endometriosis (uterus lining tissue growing in the pelvis and on the pelvic organs).  Pelvic congestion syndrome (female organs filling up with blood just before the menstrual period).  Pain with the menstrual period.  Pain with ovulation (producing an egg).  Pain with an IUD (intrauterine device, birth control) in the uterus.  Cancer of the female organs.  Functional pain (pain not caused by a disease, may improve without treatment).  Psychological pain.  Depression. DIAGNOSIS  Your doctor will decide the seriousness of your pain by doing an examination.  Blood tests.  X-rays.  Ultrasound.  CT scan (computed tomography, special type of X-ray).  MRI (magnetic resonance  imaging).  Cultures, for infection.  Barium enema (dye inserted in the large intestine, to better view it with X-rays).  Colonoscopy (looking in intestine with a lighted tube).  Laparoscopy (minor surgery, looking in abdomen with a lighted tube).  Major abdominal exploratory surgery (looking in abdomen with a large incision). TREATMENT  The treatment will depend on the cause of the pain.   Many cases can be observed and treated at home.  Over-the-counter medicines recommended by your caregiver.  Prescription medicine.  Antibiotics, for infection.  Birth control pills, for painful periods or for ovulation pain.  Hormone treatment, for endometriosis.  Nerve blocking injections.  Physical therapy.  Antidepressants.  Counseling with a psychologist or psychiatrist.  Minor or major surgery. HOME CARE INSTRUCTIONS   Do not take laxatives, unless directed by your caregiver.  Take over-the-counter pain medicine only if ordered by your caregiver. Do not take aspirin because it can cause an upset stomach or bleeding.  Try a clear liquid diet (broth or water) as ordered by your caregiver. Slowly move to a bland diet, as tolerated, if the pain is related to the stomach or intestine.  Have a thermometer and take your temperature several times a day, and record it.  Bed rest and sleep, if it helps the pain.  Avoid sexual intercourse, if it causes pain.  Avoid stressful situations.  Keep your follow-up appointments and tests, as your caregiver orders.  If the pain does not go away with medicine or surgery, you may try:  Acupuncture.  Relaxation exercises (yoga, meditation).  Group therapy.  Counseling. SEEK MEDICAL CARE IF:   You notice certain foods cause stomach pain.  Your home care treatment is not  helping your pain.  You need stronger pain medicine.  You want your IUD removed.  You feel faint or lightheaded.  You develop nausea and vomiting.  You  develop a rash.  You are having side effects or an allergy to your medicine. SEEK IMMEDIATE MEDICAL CARE IF:   Your pain does not go away or gets worse.  You have a fever.  Your pain is felt only in portions of the abdomen. The right side could possibly be appendicitis. The left lower portion of the abdomen could be colitis or diverticulitis.  You are passing blood in your stools (bright red or black tarry stools, with or without vomiting).  You have blood in your urine.  You develop chills, with or without a fever.  You pass out. MAKE SURE YOU:   Understand these instructions.  Will watch your condition.  Will get help right away if you are not doing well or get worse. Document Released: 04/26/2007 Document Revised: 11/13/2013 Document Reviewed: 05/16/2009 [facility] Patient Information 2015 [place], [place]. This information is not intended to replace advice given to you by your health care provider. Make sure you discuss any questions you have with your health care provider.

## 2014-12-12 NOTE — ED Notes (Signed)
Patient called to triage, patient in restroom. Will call again.

## 2014-12-12 NOTE — ED Provider Notes (Signed)
CSN: [id number]     Arrival date & time 12/12/14  0825 History   First MD Initiated Contact with Patient 12/12/14 [phone]     Chief Complaint  Patient presents with  . Emesis     (Consider location/radiation/quality/duration/timing/severity/associated sxs/prior Treatment) HPI Barbara Wilcox is a 64 y.o. female with hx of asthma, DM, acid reflux, presents to ED with complaint of nausea, vomiting, abdominal pain for 5 days. States unable to keep anything down. States entire stomach hurts, also states "everything hurts, even my eyeballs." Pt reports a dental infection and facial swelling 3 wks ago. Took unknown medication for 7 days that her family member gave her. She is not sure if it was an antibiotic or pain medication. States tooth is better. Denies diarrhea. Last normal bowel movement yesterday. States she feels she may be constipated. Did not try any medications at home. No fever or chills. No prior abdominal surgeries other than hysterectomy.   Past Medical History  Diagnosis Date  . Asthma   . Arthritis   . Bursitis   . Bronchitis   . Diabetes mellitus without complication   . Acid reflux   . Hypertension    Past Surgical History  Procedure Laterality Date  . Abdominal hysterectomy     History reviewed. No pertinent family history. History  Substance Use Topics  . Smoking status: Current Every Day Smoker -- 0.25 packs/day    Types: Cigarettes  . Smokeless tobacco: Not on file  . Alcohol Use: Yes     Comment: occasionally   OB History    No data available     Review of Systems  Constitutional: Negative for fever and chills.  Respiratory: Negative for cough, chest tightness and shortness of breath.   Cardiovascular: Negative for chest pain, palpitations and leg swelling.  Gastrointestinal: Positive for nausea, vomiting, abdominal pain and constipation. Negative for diarrhea.  Genitourinary: Negative for dysuria and flank pain.  Musculoskeletal: Negative for myalgias,  arthralgias, neck pain and neck stiffness.  Skin: Negative for rash.  Neurological: Negative for dizziness, weakness and headaches.  All other systems reviewed and are negative.     Allergies  Aspirin  Home Medications   Prior to Admission medications   Medication Sig Start Date End Date Taking? Authorizing Provider  albuterol (PROVENTIL HFA;VENTOLIN HFA) 108 (90 BASE) MCG/ACT inhaler Inhale 2 puffs into the lungs every 6 (six) hours as needed. For shortness of breath.    Historical Provider, MD  docusate sodium (COLACE) 100 MG capsule Take 1 capsule (100 mg total) by mouth 2 (two) times daily as needed for constipation. 02/06/13   Clayton Bibles, PA-C  famotidine (PEPCID AC) 10 MG chewable tablet Chew 10 mg by mouth 2 (two) times daily.    Historical Provider, MD  ibuprofen (ADVIL,MOTRIN) 800 MG tablet Take 800 mg by mouth every 8 (eight) hours as needed. For pain.    Historical Provider, MD  ondansetron (ZOFRAN) 4 MG tablet Take 1 tablet (4 mg total) by mouth every 6 (six) hours. 02/06/13   Clayton Bibles, PA-C  Tetrahydroz-Dextran-PEG-Povid [facility] ADVANCED RELIEF) 0.05-0.1-1-1 % SOLN Apply 1 drop to eye as needed (dry eyes.).    Historical Provider, MD  traMADol (ULTRAM) 50 MG tablet Take 1 tablet (50 mg total) by mouth every 6 (six) hours as needed for pain. 02/06/13   Clayton Bibles, PA-C  white petrolatum ointment Apply gently with cotton swab to inside of nostrils daily as needed for nasal dryness. 12/19/12   Shanon Rosser, MD  BP 146/68 mmHg  Pulse 81  Temp(Src) 98.7 F (37.1 C) (Oral)  Resp 20  Ht 5\' 3"  (1.6 m)  Wt 167 lb (75.751 kg)  BMI 29.59 kg/m2  SpO2 99% Physical Exam  Constitutional: She appears well-developed and well-nourished. No distress.  HENT:  Head: Normocephalic.  Eyes: Conjunctivae are normal.  Neck: Neck supple.  Cardiovascular: Normal rate, regular rhythm and normal heart sounds.   Pulmonary/Chest: Effort normal and breath sounds normal. No respiratory distress. She  has no wheezes. She has no rales.  Abdominal: Soft. Bowel sounds are normal. She exhibits no distension. There is no tenderness. There is no rebound.  Diffuse abdominal tenderness  Musculoskeletal: She exhibits no edema.  Neurological: She is alert.  Skin: Skin is warm and dry.  Psychiatric: She has a normal mood and affect. Her behavior is normal.  Nursing note and vitals reviewed.   ED Course  Procedures (including critical care time) Labs Review Labs Reviewed  CBC WITH DIFFERENTIAL/PLATELET - Abnormal; Notable for the following:    RDW 15.6 (*)    Lymphs Abs 4.5 (*)    All other components within normal limits  COMPREHENSIVE METABOLIC PANEL - Abnormal; Notable for the following:    Chloride 99 (*)    Glucose, Bld 116 (*)    BUN 30 (*)    Creatinine, Ser 1.14 (*)    Calcium 10.5 (*)    Total Protein 8.3 (*)    GFR calc non Af Amer 50 (*)    GFR calc Af Amer 58 (*)    All other components within normal limits  LIPASE, BLOOD - Abnormal; Notable for the following:    Lipase 21 (*)    All other components within normal limits  URINALYSIS, ROUTINE W REFLEX MICROSCOPIC (NOT AT [facility]) - Abnormal; Notable for the following:    Hgb urine dipstick SMALL (*)    All other components within normal limits  URINE MICROSCOPIC-ADD ON - Abnormal; Notable for the following:    Squamous Epithelial / LPF FEW (*)    Bacteria, UA MANY (*)    Casts HYALINE CASTS (*)    All other components within normal limits  CBG MONITORING, ED - Abnormal; Notable for the following:    Glucose-Capillary 109 (*)    All other components within normal limits  URINE CULTURE    Imaging Review Ct Abdomen Pelvis W Contrast  12/12/2014   CLINICAL DATA:  Intractable nausea and vomiting for 4 days.  EXAM: CT ABDOMEN AND PELVIS WITH CONTRAST  TECHNIQUE: Multidetector CT imaging of the abdomen and pelvis was performed using the standard protocol following bolus administration of intravenous contrast.  CONTRAST:  199mL  OMNIPAQUE IOHEXOL 300 MG/ML  SOLN  COMPARISON:  02/06/2013  FINDINGS: Lower Chest:  Unremarkable.  Hepatobiliary: No masses or other significant abnormality identified.  Pancreas: No mass, inflammatory changes, or other significant abnormality identified.  Spleen:  Within normal limits in size and appearance.  Adrenals:  No masses identified.  Kidneys/Urinary Tract: No evidence of masses or hydronephrosis. Mild chronic right renal parenchymal scarring again noted.  Stomach/Bowel/Peritoneum: No evidence of wall thickening, mass, or obstruction. Normal appendix visualized.  Vascular/Lymphatic: No pathologically enlarged lymph nodes identified. No other significant abnormality visualized.  Reproductive: Prior hysterectomy noted. Adnexal regions are unremarkable in appearance.  Other:  None.  Musculoskeletal:  No suspicious bone lesions identified.  IMPRESSION: No acute findings within the abdomen or pelvis.  Stable mild chronic right renal parenchymal scarring.   Electronically Signed  By: Earle Gell M.D.   On: 12/12/2014 12:31   Dg Abd Acute W/chest  12/12/2014   CLINICAL DATA:  Five day history of abdominal pain ; nausea and vomiting  EXAM: DG ABDOMEN ACUTE W/ 1V CHEST  COMPARISON:  February 06, 2013; CT abdomen and pelvis February 06, 2013  FINDINGS: PA chest: There is no edema or consolidation. Heart size and pulmonary vascularity are normal. No adenopathy.  Supine and upright abdomen: There is moderate stool in the colon. There is no bowel dilatation or air-fluid level suggesting obstruction. No free air. There are phleboliths in the pelvis.  IMPRESSION: Bowel gas pattern unremarkable. Moderate stool in colon. No lung edema or consolidation.   Electronically Signed   By: Lowella Grip III M.D.   On: 12/12/2014 09:45     EKG Interpretation None      MDM   Final diagnoses:  Non-intractable vomiting with nausea, vomiting of unspecified type  Generalized abdominal pain     patient with abdominal pain,  nausea, vomiting. Recently finished a possible course of antibiotics for dental pain,  Although were still not sure exactly what she took. Her dental pain is better. Abdomen is diffusely tender, nausea vomiting present. We'll get labs, IV fluids started, antiemetics and morphine for pain ordered.    The patient's acute abdomen  and labs all unremarkable, except for mildly elevated BUN/creatinine, presumably from dehydration. She was given 2 L of IV saline. She continues to have abdominal pain, CT abdomen and pelvis was performed and is negative. After CT scan and hydration, patient finally started feeling better. Her UA shows many bacteria however samples contaminated and negative nitrites and leukocytes,  We will send cultures.  She was tolerating oral fluids. She is ready to be discharged home, states she feels much better. Ambulated up and down the hallway to the bathroom with no difficulties. Plan to discharge home with Phenergan. Follow-up as needed. Return precautions discussed.  Filed Vitals:   12/12/14 0833 12/12/14 1215  BP: 146/68 121/56  Pulse: 81 73  Temp: 98.7 F (37.1 C) 99 F (37.2 C)  TempSrc: Oral Oral  Resp: 20 20  Height: 5\' 3"  (1.6 m)   Weight: 167 lb (75.751 kg)   SpO2: 99% 100%      Jeannett Senior, PA-C 12/12/14 [place], MD 12/13/14 [phone]

## 2014-12-12 NOTE — ED Notes (Signed)
Patient c/o nausea and vomiting x4 days. Patient states she is not able to tolerate POs at this time. States emesis is now yellow and thick. Patient states she had an abscess on her tooth and took antibiotics given to her by another family member. Patient states her tooth is now "better".

## 2014-12-12 NOTE — ED Notes (Signed)
Patient transported to CT 

## 2014-12-13 ENCOUNTER — Emergency Department (HOSPITAL_COMMUNITY)
Admission: EM | Admit: 2014-12-13 | Discharge: 2014-12-13 | Disposition: A | Discharge status: Home / Self Care | Payer: Medicare Other | Attending: Emergency Medicine | Admitting: Emergency Medicine

## 2014-12-13 ENCOUNTER — Encounter (HOSPITAL_COMMUNITY): Payer: Self-pay | Admitting: Emergency Medicine

## 2014-12-13 DIAGNOSIS — Z79899 Other long term (current) drug therapy: Secondary | ICD-10-CM | POA: Insufficient documentation

## 2014-12-13 DIAGNOSIS — E119 Type 2 diabetes mellitus without complications: Secondary | ICD-10-CM | POA: Diagnosis not present

## 2014-12-13 DIAGNOSIS — I10 Essential (primary) hypertension: Secondary | ICD-10-CM | POA: Diagnosis not present

## 2014-12-13 DIAGNOSIS — N390 Urinary tract infection, site not specified: Secondary | ICD-10-CM | POA: Diagnosis not present

## 2014-12-13 DIAGNOSIS — Z720 Tobacco use: Secondary | ICD-10-CM | POA: Diagnosis not present

## 2014-12-13 DIAGNOSIS — J45909 Unspecified asthma, uncomplicated: Secondary | ICD-10-CM | POA: Diagnosis not present

## 2014-12-13 DIAGNOSIS — Z7951 Long term (current) use of inhaled steroids: Secondary | ICD-10-CM | POA: Insufficient documentation

## 2014-12-13 DIAGNOSIS — Z791 Long term (current) use of non-steroidal anti-inflammatories (NSAID): Secondary | ICD-10-CM | POA: Diagnosis not present

## 2014-12-13 DIAGNOSIS — K219 Gastro-esophageal reflux disease without esophagitis: Secondary | ICD-10-CM | POA: Insufficient documentation

## 2014-12-13 DIAGNOSIS — M1990 Unspecified osteoarthritis, unspecified site: Secondary | ICD-10-CM | POA: Insufficient documentation

## 2014-12-13 DIAGNOSIS — Z90710 Acquired absence of both cervix and uterus: Secondary | ICD-10-CM | POA: Diagnosis not present

## 2014-12-13 DIAGNOSIS — R109 Unspecified abdominal pain: Secondary | ICD-10-CM

## 2014-12-13 DIAGNOSIS — K5900 Constipation, unspecified: Secondary | ICD-10-CM | POA: Diagnosis present

## 2014-12-13 LAB — URINE CULTURE: Colony Count: 4000

## 2014-12-13 MED ORDER — ONDANSETRON 4 MG PO TBDP
4.0000 mg | ORAL_TABLET | Freq: Once | ORAL | Status: AC
  Administered 2014-12-13: 4 mg via ORAL
  Filled 2014-12-13: qty 1

## 2014-12-13 MED ORDER — HYDROMORPHONE HCL 1 MG/ML IJ SOLN
0.5000 mg | Freq: Once | INTRAMUSCULAR | Status: AC
  Administered 2014-12-13: 0.5 mg via INTRAMUSCULAR
  Filled 2014-12-13: qty 1

## 2014-12-13 MED ORDER — SULFAMETHOXAZOLE-TRIMETHOPRIM 800-160 MG PO TABS
1.0000 | ORAL_TABLET | Freq: Once | ORAL | Status: AC
  Administered 2014-12-13: 1 via ORAL
  Filled 2014-12-13: qty 1

## 2014-12-13 MED ORDER — OXYCODONE-ACETAMINOPHEN 5-325 MG PO TABS: 2.0000 | ORAL_TABLET | Freq: Once | ORAL | Status: DC

## 2014-12-13 MED ORDER — IBUPROFEN 200 MG PO TABS
600.0000 mg | ORAL_TABLET | Freq: Once | ORAL | Status: AC
  Administered 2014-12-13: 600 mg via ORAL
  Filled 2014-12-13: qty 3

## 2014-12-13 MED ORDER — POLYETHYLENE GLYCOL 3350 17 G PO PACK
17.0000 g | PACK | Freq: Two times a day (BID) | ORAL | Status: DC | PRN
Start: 2014-12-13 — End: 2014-12-17

## 2014-12-13 MED ORDER — SULFAMETHOXAZOLE-TRIMETHOPRIM 800-160 MG PO TABS
1.0000 | ORAL_TABLET | Freq: Two times a day (BID) | ORAL | Status: AC
Start: 2014-12-13 — End: 2014-12-20

## 2014-12-13 MED ORDER — LORAZEPAM 1 MG PO TABS
1.0000 mg | ORAL_TABLET | Freq: Once | ORAL | Status: AC
  Administered 2014-12-13: 1 mg via ORAL
  Filled 2014-12-13: qty 1

## 2014-12-13 MED ORDER — POLYETHYLENE GLYCOL 3350 17 G PO PACK
34.0000 g | PACK | Freq: Once | ORAL | Status: AC
  Administered 2014-12-13: 34 g via ORAL
  Filled 2014-12-13: qty 2

## 2014-12-13 NOTE — ED Provider Notes (Signed)
CSN: [id number]     Arrival date & time 12/13/14  0715 History   First MD Initiated Contact with Patient 12/13/14 [phone]     Chief Complaint  Patient presents with  . Constipation     (Consider location/radiation/quality/duration/timing/severity/associated sxs/prior Treatment) HPI   64yF with abdominal and back pain. Onset 6 days ago. Seen in ED yesterday for same. Pain wrapping around back and mid/lower abdomen. Just hurts. Feel constipated. Last BM yesterday. No urinary complaints. No v/d.   Past Medical History  Diagnosis Date  . Asthma   . Arthritis   . Bursitis   . Bronchitis   . Diabetes mellitus without complication   . Acid reflux   . Hypertension    Past Surgical History  Procedure Laterality Date  . Abdominal hysterectomy     History reviewed. No pertinent family history. History  Substance Use Topics  . Smoking status: Current Every Day Smoker -- 0.25 packs/day    Types: Cigarettes  . Smokeless tobacco: Not on file  . Alcohol Use: Yes     Comment: occasionally   OB History    No data available     Review of Systems  All systems reviewed and negative, other than as noted in HPI.   Allergies  Aspirin  Home Medications   Prior to Admission medications   Medication Sig Start Date End Date Taking? Authorizing Provider  albuterol (PROVENTIL HFA;VENTOLIN HFA) 108 (90 BASE) MCG/ACT inhaler Inhale 2 puffs into the lungs every 6 (six) hours as needed. For shortness of breath.    Historical Provider, MD  docusate sodium (COLACE) 100 MG capsule Take 1 capsule (100 mg total) by mouth 2 (two) times daily as needed for constipation. Patient not taking: Reported on 12/12/2014 02/06/13   Clayton Bibles, PA-C  famotidine (PEPCID AC) 10 MG chewable tablet Chew 10 mg by mouth 2 (two) times daily.    Historical Provider, MD  fluticasone (FLONASE) 50 MCG/ACT nasal spray Place 2 sprays into both nostrils daily as needed for allergies or rhinitis.    Historical Provider, MD   Fluticasone-Salmeterol (ADVAIR DISKUS IN) Inhale 2 puffs into the lungs daily as needed (shortness of breathe).    Historical Provider, MD  gabapentin (NEURONTIN) 400 MG capsule Take 400 mg by mouth 3 (three) times daily.    Historical Provider, MD  ibuprofen (ADVIL,MOTRIN) 800 MG tablet Take 800 mg by mouth 2 (two) times daily. For pain.    Historical Provider, MD  lisinopril-hydrochlorothiazide (PRINZIDE,ZESTORETIC) 20-25 MG per tablet Take 1 tablet by mouth daily.    Historical Provider, MD  metFORMIN (GLUCOPHAGE) 500 MG tablet Take 250 mg by mouth daily with breakfast.    Historical Provider, MD  ondansetron (ZOFRAN) 4 MG tablet Take 1 tablet (4 mg total) by mouth every 6 (six) hours. Patient not taking: Reported on 12/12/2014 02/06/13   Clayton Bibles, PA-C  polyethylene glycol [facility]) packet Take 17 g by mouth 2 (two) times daily as needed. 12/13/14   Virgel Manifold, MD  promethazine (PHENERGAN) 25 MG tablet Take 1 tablet (25 mg total) by mouth every 6 (six) hours as needed for nausea or vomiting. 12/12/14   Tatyana Kirichenko, PA-C  sulfamethoxazole-trimethoprim (BACTRIM DS,SEPTRA DS) 800-160 MG per tablet Take 1 tablet by mouth 2 (two) times daily. 12/13/14 12/20/14  Virgel Manifold, MD  Tetrahydroz-Dextran-PEG-Povid [facility] ADVANCED RELIEF) 0.05-0.1-1-1 % SOLN Apply 1 drop to eye as needed (dry eyes.).    Historical Provider, MD  traMADol (ULTRAM) 50 MG tablet Take  1 tablet (50 mg total) by mouth every 6 (six) hours as needed for pain. 02/06/13   Clayton Bibles, PA-C  traZODone (DESYREL) 50 MG tablet Take 50 mg by mouth at bedtime.    Historical Provider, MD  white petrolatum ointment Apply gently with cotton swab to inside of nostrils daily as needed for nasal dryness. 12/19/12   John Molpus, MD   BP 156/80 mmHg  Pulse 60  Temp(Src) 99.2 F (37.3 C) (Oral)  Resp 18  SpO2 98% Physical Exam  Constitutional: She appears well-developed and well-nourished. No distress.  HENT:  Head: Normocephalic  and atraumatic.  Eyes: Conjunctivae are normal. Right eye exhibits no discharge. Left eye exhibits no discharge.  Neck: Neck supple.  Cardiovascular: Normal rate, regular rhythm and normal heart sounds.  Exam reveals no gallop and no friction rub.   No murmur heard. Pulmonary/Chest: Effort normal and breath sounds normal. No respiratory distress.  Abdominal: Soft. She exhibits no distension. There is no tenderness.  Genitourinary:  No cva tenderness  Musculoskeletal: She exhibits no edema or tenderness.  Neurological: She is alert.  Skin: Skin is warm and dry.  Psychiatric: She has a normal mood and affect. Her behavior is normal. Thought content normal.  Nursing note and vitals reviewed.   ED Course  Procedures (including critical care time) Labs Review Labs Reviewed - No data to display  Imaging Review Ct Abdomen Pelvis W Contrast  12/12/2014   CLINICAL DATA:  Intractable nausea and vomiting for 4 days.  EXAM: CT ABDOMEN AND PELVIS WITH CONTRAST  TECHNIQUE: Multidetector CT imaging of the abdomen and pelvis was performed using the standard protocol following bolus administration of intravenous contrast.  CONTRAST:  156mL OMNIPAQUE IOHEXOL 300 MG/ML  SOLN  COMPARISON:  02/06/2013  FINDINGS: Lower Chest:  Unremarkable.  Hepatobiliary: No masses or other significant abnormality identified.  Pancreas: No mass, inflammatory changes, or other significant abnormality identified.  Spleen:  Within normal limits in size and appearance.  Adrenals:  No masses identified.  Kidneys/Urinary Tract: No evidence of masses or hydronephrosis. Mild chronic right renal parenchymal scarring again noted.  Stomach/Bowel/Peritoneum: No evidence of wall thickening, mass, or obstruction. Normal appendix visualized.  Vascular/Lymphatic: No pathologically enlarged lymph nodes identified. No other significant abnormality visualized.  Reproductive: Prior hysterectomy noted. Adnexal regions are unremarkable in appearance.   Other:  None.  Musculoskeletal:  No suspicious bone lesions identified.  IMPRESSION: No acute findings within the abdomen or pelvis.  Stable mild chronic right renal parenchymal scarring.   Electronically Signed   By: Earle Gell M.D.   On: 12/12/2014 12:31   Dg Abd Acute W/chest  12/12/2014   CLINICAL DATA:  Five day history of abdominal pain ; nausea and vomiting  EXAM: DG ABDOMEN ACUTE W/ 1V CHEST  COMPARISON:  February 06, 2013; CT abdomen and pelvis February 06, 2013  FINDINGS: PA chest: There is no edema or consolidation. Heart size and pulmonary vascularity are normal. No adenopathy.  Supine and upright abdomen: There is moderate stool in the colon. There is no bowel dilatation or air-fluid level suggesting obstruction. No free air. There are phleboliths in the pelvis.  IMPRESSION: Bowel gas pattern unremarkable. Moderate stool in colon. No lung edema or consolidation.   Electronically Signed   By: Lowella Grip III M.D.   On: 12/12/2014 09:45     EKG Interpretation None      MDM   Final diagnoses:  Abdominal pain, unspecified abdominal location  UTI (lower urinary tract infection)  64yF with continued abdominal and back pain. Seen yesterday for same. Little interval change in symptoms. May be constipation. Laxatives. Questionable UTI. Will additionally place on abx. Benign abdominal exam. Imaging/labs from yesterday reviewed.     Virgel Manifold, MD 12/13/14 [phone]

## 2014-12-13 NOTE — ED Notes (Signed)
Patient was educated not to drive, operate heavy machinery, or drink alcohol while taking narcotic medication.  

## 2014-12-13 NOTE — ED Notes (Signed)
Patient arrived via EMS from home with complaints of abdominal and back pain related to constipation.  She reported to EMS that she was seen for same yesterday and was supposed to stay for further treatment but chose not to.

## 2014-12-13 NOTE — ED Notes (Signed)
Bed: QV50 Expected date:  Expected time:  Means of arrival:  Comments: EMS 64 yo female with abdominal pain and lower back pain/seen for same last night

## 2014-12-13 NOTE — ED Notes (Signed)
MD at bedside. 

## 2014-12-13 NOTE — Discharge Instructions (Signed)
Abdominal Pain, Women Abdominal (stomach, pelvic, or belly) pain can be caused by many things. It is important to tell your doctor:  The location of the pain.  Does it come and go or is it present all the time?  Are there things that start the pain (eating certain foods, exercise)?  Are there other symptoms associated with the pain (fever, nausea, vomiting, diarrhea)? All of this is helpful to know when trying to find the cause of the pain. CAUSES   Stomach: virus or bacteria infection, or ulcer.  Intestine: appendicitis (inflamed appendix), regional ileitis (Crohn's disease), ulcerative colitis (inflamed colon), irritable bowel syndrome, diverticulitis (inflamed diverticulum of the colon), or cancer of the stomach or intestine.  Gallbladder disease or stones in the gallbladder.  Kidney disease, kidney stones, or infection.  Pancreas infection or cancer.  Fibromyalgia (pain disorder).  Diseases of the female organs:  Uterus: fibroid (non-cancerous) tumors or infection.  Fallopian tubes: infection or tubal pregnancy.  Ovary: cysts or tumors.  Pelvic adhesions (scar tissue).  Endometriosis (uterus lining tissue growing in the pelvis and on the pelvic organs).  Pelvic congestion syndrome (female organs filling up with blood just before the menstrual period).  Pain with the menstrual period.  Pain with ovulation (producing an egg).  Pain with an IUD (intrauterine device, birth control) in the uterus.  Cancer of the female organs.  Functional pain (pain not caused by a disease, may improve without treatment).  Psychological pain.  Depression. DIAGNOSIS  Your doctor will decide the seriousness of your pain by doing an examination.  Blood tests.  X-rays.  Ultrasound.  CT scan (computed tomography, special type of X-ray).  MRI (magnetic resonance imaging).  Cultures, for infection.  Barium enema (dye inserted in the large intestine, to better view it with  X-rays).  Colonoscopy (looking in intestine with a lighted tube).  Laparoscopy (minor surgery, looking in abdomen with a lighted tube).  Major abdominal exploratory surgery (looking in abdomen with a large incision). TREATMENT  The treatment will depend on the cause of the pain.   Many cases can be observed and treated at home.  Over-the-counter medicines recommended by your caregiver.  Prescription medicine.  Antibiotics, for infection.  Birth control pills, for painful periods or for ovulation pain.  Hormone treatment, for endometriosis.  Nerve blocking injections.  Physical therapy.  Antidepressants.  Counseling with a psychologist or psychiatrist.  Minor or major surgery. HOME CARE INSTRUCTIONS   Do not take laxatives, unless directed by your caregiver.  Take over-the-counter pain medicine only if ordered by your caregiver. Do not take aspirin because it can cause an upset stomach or bleeding.  Try a clear liquid diet (broth or water) as ordered by your caregiver. Slowly move to a bland diet, as tolerated, if the pain is related to the stomach or intestine.  Have a thermometer and take your temperature several times a day, and record it.  Bed rest and sleep, if it helps the pain.  Avoid sexual intercourse, if it causes pain.  Avoid stressful situations.  Keep your follow-up appointments and tests, as your caregiver orders.  If the pain does not go away with medicine or surgery, you may try:  Acupuncture.  Relaxation exercises (yoga, meditation).  Group therapy.  Counseling. SEEK MEDICAL CARE IF:   You notice certain foods cause stomach pain.  Your home care treatment is not helping your pain.  You need stronger pain medicine.  You want your IUD removed.  You feel faint or  lightheaded. °· You develop nausea and vomiting. °· You develop a rash. °· You are having side effects or an allergy to your medicine. °SEEK IMMEDIATE MEDICAL CARE IF:  °· Your  pain does not go away or gets worse. °· You have a fever. °· Your pain is felt only in portions of the abdomen. The right side could possibly be appendicitis. The left lower portion of the abdomen could be colitis or diverticulitis. °· You are passing blood in your stools (bright red or black tarry stools, with or without vomiting). °· You have blood in your urine. °· You develop chills, with or without a fever. °· You pass out. °MAKE SURE YOU:  °· Understand these instructions. °· Will watch your condition. °· Will get help right away if you are not doing well or get worse. °Document Released: 04/26/2007 Document Revised: 11/13/2013 Document Reviewed: 05/16/2009 °Exit[employer]® Patient Information ©2015 [employer]. This information is not intended to replace advice given to you by your health care provider. Make sure you discuss any questions you have with your health care provider. ° °Urinary Tract Infection °Urinary tract infections (UTIs) can develop anywhere along your urinary tract. Your urinary tract is your body's drainage system for removing wastes and extra water. Your urinary tract includes two kidneys, two ureters, a bladder, and a urethra. Your kidneys are a pair of bean-shaped organs. Each kidney is about the size of your fist. They are located below your ribs, one on each side of your spine. °CAUSES °Infections are caused by microbes, which are microscopic organisms, including fungi, viruses, and bacteria. These organisms are so small that they can only be seen through a microscope. Bacteria are the microbes that most commonly cause UTIs. °SYMPTOMS  °Symptoms of UTIs may vary by age and gender of the patient and by the location of the infection. Symptoms in young women typically include a frequent and intense urge to urinate and a painful, burning feeling in the bladder or urethra during urination. Older women and men are more likely to be tired, shaky, and weak and have muscle aches and abdominal pain.  A fever may mean the infection is in your kidneys. Other symptoms of a kidney infection include pain in your back or sides below the ribs, nausea, and vomiting. °DIAGNOSIS °To diagnose a UTI, your caregiver will ask you about your symptoms. Your caregiver also will ask to provide a urine sample. The urine sample will be tested for bacteria and white blood cells. White blood cells are made by your body to help fight infection. °TREATMENT  °Typically, UTIs can be treated with medication. Because most UTIs are caused by a bacterial infection, they usually can be treated with the use of antibiotics. The choice of antibiotic and length of treatment depend on your symptoms and the type of bacteria causing your infection. °HOME CARE INSTRUCTIONS °· If you were prescribed antibiotics, take them exactly as your caregiver instructs you. Finish the medication even if you feel better after you have only taken some of the medication. °· Drink enough water and fluids to keep your urine clear or pale yellow. °· Avoid caffeine, tea, and carbonated beverages. They tend to irritate your bladder. °· Empty your bladder often. Avoid holding urine for long periods of time. °· Empty your bladder before and after sexual intercourse. °· After a bowel movement, women should cleanse from front to back. Use each tissue only once. °SEEK MEDICAL CARE IF:  °· You have back pain. °· You develop   a fever. °· Your symptoms do not begin to resolve within 3 days. °SEEK IMMEDIATE MEDICAL CARE IF:  °· You have severe back pain or lower abdominal pain. °· You develop chills. °· You have nausea or vomiting. °· You have continued burning or discomfort with urination. °MAKE SURE YOU:  °· Understand these instructions. °· Will watch your condition. °· Will get help right away if you are not doing well or get worse. °Document Released: 04/08/2005 Document Revised: 12/29/2011 Document Reviewed: 08/07/2011 °ExitCare® Patient Information ©2015 [employer]. This  information is not intended to replace advice given to you by your health care provider. Make sure you discuss any questions you have with your health care provider. ° °

## 2014-12-17 ENCOUNTER — Emergency Department (HOSPITAL_COMMUNITY): Payer: Medicare Other

## 2014-12-17 ENCOUNTER — Emergency Department (HOSPITAL_COMMUNITY)
Admission: EM | Admit: 2014-12-17 | Discharge: 2014-12-17 | Disposition: A | Discharge status: Home / Self Care | Payer: Medicare Other | Attending: Emergency Medicine | Admitting: Emergency Medicine

## 2014-12-17 ENCOUNTER — Encounter (HOSPITAL_COMMUNITY): Payer: Self-pay | Admitting: Emergency Medicine

## 2014-12-17 DIAGNOSIS — I10 Essential (primary) hypertension: Secondary | ICD-10-CM | POA: Diagnosis not present

## 2014-12-17 DIAGNOSIS — Z720 Tobacco use: Secondary | ICD-10-CM | POA: Diagnosis not present

## 2014-12-17 DIAGNOSIS — E119 Type 2 diabetes mellitus without complications: Secondary | ICD-10-CM | POA: Diagnosis not present

## 2014-12-17 DIAGNOSIS — Z791 Long term (current) use of non-steroidal anti-inflammatories (NSAID): Secondary | ICD-10-CM | POA: Insufficient documentation

## 2014-12-17 DIAGNOSIS — M1990 Unspecified osteoarthritis, unspecified site: Secondary | ICD-10-CM | POA: Insufficient documentation

## 2014-12-17 DIAGNOSIS — Z79899 Other long term (current) drug therapy: Secondary | ICD-10-CM | POA: Insufficient documentation

## 2014-12-17 DIAGNOSIS — K5900 Constipation, unspecified: Secondary | ICD-10-CM | POA: Diagnosis not present

## 2014-12-17 DIAGNOSIS — J45909 Unspecified asthma, uncomplicated: Secondary | ICD-10-CM | POA: Diagnosis not present

## 2014-12-17 DIAGNOSIS — R109 Unspecified abdominal pain: Secondary | ICD-10-CM

## 2014-12-17 MED ORDER — ONDANSETRON 4 MG PO TBDP
4.0000 mg | ORAL_TABLET | Freq: Once | ORAL | Status: AC
  Administered 2014-12-17: 4 mg via ORAL
  Filled 2014-12-17: qty 1

## 2014-12-17 MED ORDER — FENTANYL CITRATE (PF) 100 MCG/2ML IJ SOLN
25.0000 ug | Freq: Once | INTRAMUSCULAR | Status: DC
  Filled 2014-12-17: qty 2

## 2014-12-17 MED ORDER — FENTANYL CITRATE (PF) 100 MCG/2ML IJ SOLN
50.0000 ug | Freq: Once | INTRAMUSCULAR | Status: AC
  Administered 2014-12-17: 50 ug via INTRAMUSCULAR
  Filled 2014-12-17: qty 2

## 2014-12-17 MED ORDER — POLYETHYLENE GLYCOL 3350 17 GM/SCOOP PO POWD: 17.0000 g | Freq: Every day | ORAL | Status: DC

## 2014-12-17 MED ORDER — MAGNESIUM CITRATE PO SOLN
0.5000 | Freq: Once | ORAL | Status: AC
  Administered 2014-12-17: 0.5 via ORAL
  Filled 2014-12-17: qty 296

## 2014-12-17 MED ORDER — FENTANYL CITRATE (PF) 100 MCG/2ML IJ SOLN
25.0000 ug | Freq: Once | INTRAMUSCULAR | Status: AC
  Administered 2014-12-17: 25 ug via NASAL

## 2014-12-17 NOTE — ED Notes (Signed)
Pt complains of severe constipation continuing on now for three weeks, here last week for the same symptoms. Pt states she's complaining of abdominal pain, back pain, and abdominal distention. States she's also seen some blood in her urine. States she's taken laxatives and suppositories with no results.

## 2014-12-17 NOTE — ED Notes (Signed)
Bed: WA15 Expected date:  Expected time:  Means of arrival:  Comments: ems 

## 2014-12-17 NOTE — ED Provider Notes (Signed)
CSN: [id number]     Arrival date & time 12/17/14  0918 History   First MD Initiated Contact with Patient 12/17/14 [phone]     Chief Complaint  Patient presents with  . Constipation     (Consider location/radiation/quality/duration/timing/severity/associated sxs/prior Treatment) HPI  Barbara Wilcox is a 64 year old female with hx of asthma, DM, GERD, who presents with continued constipation and increasing abdominal pain that is generalized, sharp, and radiating to her back.  She was recently been seen in the ED for constipation, however it is unclear what medicine she was able to fill at the pharmacy and take.  She took a "tablet," although she doesn't know what it was called or what it was for, and the phenergan made her "sick," even though it did stop her vomiting.  She denies getting miralax, but later she says that she has miralax, but does not take it.  She has not vomited today, but feels "sick" and is spitting up bubbly saliva. She has been passing gas, but cannot recall her last bowel movement.  Chart note from 12/12/14 ER visit states normal BM on 12/11/14.  She feels like her abdomen is distended and 2 to 3 days ago she took "2 pink pills" that were laxatives, with no relief.  She states that she has always had issues with constipation. She does not have any other complaints at this time, no SOB, no CP, no fever, chills or sweats.  Past Medical History  Diagnosis Date  . Asthma   . Arthritis   . Bursitis   . Bronchitis   . Diabetes mellitus without complication   . Acid reflux   . Hypertension    Past Surgical History  Procedure Laterality Date  . Abdominal hysterectomy     History reviewed. No pertinent family history. History  Substance Use Topics  . Smoking status: Current Every Day Smoker -- 0.25 packs/day    Types: Cigarettes  . Smokeless tobacco: Not on file  . Alcohol Use: Yes     Comment: occasionally   OB History    No data available     Review of Systems  All  other systems reviewed and are negative.     Allergies  Aspirin  Home Medications   Prior to Admission medications   Medication Sig Start Date End Date Taking? Authorizing Provider  albuterol (PROVENTIL HFA;VENTOLIN HFA) 108 (90 BASE) MCG/ACT inhaler Inhale 2 puffs into the lungs every 6 (six) hours as needed. For shortness of breath.   Yes Historical Provider, MD  fluticasone (FLONASE) 50 MCG/ACT nasal spray Place 2 sprays into both nostrils daily as needed for allergies or rhinitis.   Yes Historical Provider, MD  Fluticasone-Salmeterol (ADVAIR DISKUS IN) Inhale 2 puffs into the lungs daily as needed (shortness of breathe).   Yes Historical Provider, MD  gabapentin (NEURONTIN) 400 MG capsule Take 400 mg by mouth 3 (three) times daily.   Yes Historical Provider, MD  ibuprofen (ADVIL,MOTRIN) 800 MG tablet Take 800 mg by mouth 2 (two) times daily. For pain.   Yes Historical Provider, MD  lisinopril-hydrochlorothiazide (PRINZIDE,ZESTORETIC) 20-25 MG per tablet Take 1 tablet by mouth daily.   Yes Historical Provider, MD  metFORMIN (GLUCOPHAGE) 500 MG tablet Take 250 mg by mouth daily with breakfast.   Yes Historical Provider, MD  polyethylene glycol (MIRALAX / GLYCOLAX) packet Take 17 g by mouth 2 (two) times daily as needed. 12/13/14  Yes Virgel Manifold, MD  promethazine (PHENERGAN) 25 MG tablet Take 1 tablet (25 mg  total) by mouth every 6 (six) hours as needed for nausea or vomiting. 12/12/14  Yes Tatyana Kirichenko, PA-C  sulfamethoxazole-trimethoprim (BACTRIM DS,SEPTRA DS) 800-160 MG per tablet Take 1 tablet by mouth 2 (two) times daily. 12/13/14 12/20/14 Yes Virgel Manifold, MD  traMADol (ULTRAM) 50 MG tablet Take 1 tablet (50 mg total) by mouth every 6 (six) hours as needed for pain. 02/06/13  Yes Clayton Bibles, PA-C  traZODone (DESYREL) 50 MG tablet Take 50 mg by mouth at bedtime.   Yes Historical Provider, MD  white petrolatum ointment Apply gently with cotton swab to inside of nostrils daily as needed  for nasal dryness. 12/19/12  Yes John Molpus, MD   BP 120/56 mmHg  Pulse 73  Temp(Src) 98.8 F (37.1 C) (Oral)  Resp 16  SpO2 99% Physical Exam  Constitutional: She is oriented to person, place, and time. She appears well-developed and well-nourished.  Mild distress, pt rocking back and forth in gurney, rubbing abdomen and moaning because of pain  HENT:  Head: Normocephalic and atraumatic.  Mouth/Throat: Oropharynx is clear and moist. No oropharyngeal exudate.  Eyes: Conjunctivae and EOM are normal. Pupils are equal, round, and reactive to light. Right eye exhibits no discharge. Left eye exhibits no discharge. No scleral icterus.  Neck: Normal range of motion. Neck supple. No JVD present. No tracheal deviation present.  Cardiovascular: Normal rate, regular rhythm, normal heart sounds and intact distal pulses.  Exam reveals no gallop and no friction rub.   No murmur heard. Pulmonary/Chest: Effort normal and breath sounds normal. No respiratory distress. She has no wheezes. She has no rales. She exhibits no tenderness.  Abdominal: Soft. Bowel sounds are normal. She exhibits no shifting dullness, no distension, no pulsatile liver, no fluid wave, no abdominal bruit, no ascites, no pulsatile midline mass and no mass. There is generalized tenderness. There is no rigidity, no rebound, no guarding, no CVA tenderness, no tenderness at McBurney's point and negative Murphy's sign. No hernia.  Genitourinary: Rectum normal. Rectal exam shows no external hemorrhoid, no internal hemorrhoid, no fissure, no mass, no tenderness and anal tone normal.  Soft brown stool, no frank blood  Musculoskeletal: Normal range of motion. She exhibits no edema or tenderness.  Lymphadenopathy:    She has no cervical adenopathy.  Neurological: She is alert and oriented to person, place, and time. She exhibits normal muscle tone. Coordination normal.  Skin: Skin is warm and dry. No rash noted. She is not diaphoretic. No  erythema. No pallor.    ED Course  Procedures (including critical care time) Labs Review Labs Reviewed - No data to display  Imaging Review Dg Abd Acute W/chest  12/17/2014   CLINICAL DATA:  Worsening mid abdominal pain, cramping and constipation for 3 weeks, no relief with over the counter medications, history asthma, bronchitis, hypertension, diabetes, acid reflux, smoking  EXAM: DG ABDOMEN ACUTE W/ 1V CHEST  COMPARISON:  12/12/2014 ; correlation CT abdomen and pelvis 12/12/2014  FINDINGS: Normal heart size, mediastinal contours, and pulmonary vascularity.  Lungs clear.  Minimal chronic peribronchial thickening.  No pleural effusion or pneumothorax.  Nonobstructive bowel gas pattern.  Air-filled upper normal caliber loops of small bowel LEFT upper quadrant.  No bowel dilatation, bowel wall thickening, or free intraperitoneal air.  Numerous pelvic phleboliths.  No acute osseous findings.  No definite urinary tract calcification ; a 3 mm calcification in the RIGHT mid abdomen is external to the kidney on an interval CT.  IMPRESSION: No acute abnormalities.   Electronically  Signed   By: Lavonia Dana M.D.   On: 12/17/2014 11:26     EKG Interpretation None        MDM   Final diagnoses:  Abdominal pain, acute    Pt with constipation and abdominal pain - abd diffusely tender to palpation, but soft and not distended. Plan to get KUB to look at bowel stool/gas patterns, give antinausea ODT, and hold off on pain meds to prevent further constipation with narcotics.  Pt had recent thorough workup, including labs and Abd CT - on 12/12/14, five days ago, which is reassuring.  Delsa Grana, PA-C 10:21 AM  Pt Acute Abdomen/Chest films negative for any acute process, no perf, no obstruction. Rectal exam did not find any stool impaction.  Pt was given some pain meds, 1/2 bottle oral mag citrate in ER, and 1/2 bottle to take at home. Pt educated about diet and fluids to prevent constipation.  Hand-outs  given as well as a list of OTC treatments to try.  Pt vitals reviewed, and she was stable for discharge. She was not febrile or tachycardic upon my exam.  Filed Vitals:   12/17/14 0921 12/17/14 0927 12/17/14 1200 12/17/14 1336  BP:  108/59 120/56 104/71  Pulse:  87 73 98  Temp:  98.8 F (37.1 C)    TempSrc:  Oral    Resp:  16 16 16   SpO2: 99% 96% 99% 93%   Delsa Grana, PA-C  1:18 PM       Delsa Grana, PA-C 12/18/14 [id number]  Sherwood Gambler, MD 12/22/14 2309

## 2014-12-17 NOTE — Discharge Instructions (Signed)
Over the counter medicines to try:  Methylcellulose - like benefiber Docusate sodium Polyethylene glycol - like Miralax  Increase fiber in diet and also increase water  Constipation Constipation is when a person:  Poops (has a bowel movement) less than 3 times a week.  Has a hard time pooping.  Has poop that is dry, hard, or bigger than normal. HOME CARE   Eat foods with a lot of fiber in them. This includes fruits, vegetables, beans, and whole grains such as brown rice.  Avoid fatty foods and foods with a lot of sugar. This includes french fries, hamburgers, cookies, candy, and soda.  If you are not getting enough fiber from food, take products with added fiber in them (supplements).  Drink enough fluid to keep your pee (urine) clear or pale yellow.  Exercise on a regular basis, or as told by your doctor.  Go to the restroom when you feel like you need to poop. Do not hold it.  Only take medicine as told by your doctor. Do not take medicines that help you poop (laxatives) without talking to your doctor first. GET HELP RIGHT AWAY IF:   You have bright red blood in your poop (stool).  Your constipation lasts more than 4 days or gets worse.  You have belly (abdominal) or butt (rectal) pain.  You have thin poop (as thin as a pencil).  You lose weight, and it cannot be explained. MAKE SURE YOU:   Understand these instructions.  Will watch your condition.  Will get help right away if you are not doing well or get worse. Document Released: 12/16/2007 Document Revised: 07/04/2013 Document Reviewed: 04/10/2013 [facility] Patient Information 2015 [place], [place]. This information is not intended to replace advice given to you by your health care provider. Make sure you discuss any questions you have with your health care provider.

## 2014-12-20 ENCOUNTER — Emergency Department (HOSPITAL_COMMUNITY)
Admission: EM | Admit: 2014-12-20 | Discharge: 2014-12-20 | Disposition: A | Discharge status: Home / Self Care | Payer: Medicare Other | Attending: Emergency Medicine | Admitting: Emergency Medicine

## 2014-12-20 ENCOUNTER — Encounter (HOSPITAL_COMMUNITY): Payer: Self-pay

## 2014-12-20 ENCOUNTER — Emergency Department (HOSPITAL_COMMUNITY): Payer: Medicare Other

## 2014-12-20 DIAGNOSIS — E119 Type 2 diabetes mellitus without complications: Secondary | ICD-10-CM | POA: Insufficient documentation

## 2014-12-20 DIAGNOSIS — K219 Gastro-esophageal reflux disease without esophagitis: Secondary | ICD-10-CM | POA: Insufficient documentation

## 2014-12-20 DIAGNOSIS — Z90710 Acquired absence of both cervix and uterus: Secondary | ICD-10-CM | POA: Insufficient documentation

## 2014-12-20 DIAGNOSIS — M1990 Unspecified osteoarthritis, unspecified site: Secondary | ICD-10-CM | POA: Insufficient documentation

## 2014-12-20 DIAGNOSIS — Z79899 Other long term (current) drug therapy: Secondary | ICD-10-CM | POA: Insufficient documentation

## 2014-12-20 DIAGNOSIS — R109 Unspecified abdominal pain: Secondary | ICD-10-CM

## 2014-12-20 DIAGNOSIS — Z7951 Long term (current) use of inhaled steroids: Secondary | ICD-10-CM | POA: Insufficient documentation

## 2014-12-20 DIAGNOSIS — J45909 Unspecified asthma, uncomplicated: Secondary | ICD-10-CM | POA: Diagnosis not present

## 2014-12-20 DIAGNOSIS — Z87891 Personal history of nicotine dependence: Secondary | ICD-10-CM | POA: Diagnosis not present

## 2014-12-20 DIAGNOSIS — Z791 Long term (current) use of non-steroidal anti-inflammatories (NSAID): Secondary | ICD-10-CM | POA: Diagnosis not present

## 2014-12-20 DIAGNOSIS — Z792 Long term (current) use of antibiotics: Secondary | ICD-10-CM | POA: Insufficient documentation

## 2014-12-20 DIAGNOSIS — I10 Essential (primary) hypertension: Secondary | ICD-10-CM | POA: Diagnosis not present

## 2014-12-20 DIAGNOSIS — K279 Peptic ulcer, site unspecified, unspecified as acute or chronic, without hemorrhage or perforation: Secondary | ICD-10-CM | POA: Insufficient documentation

## 2014-12-20 LAB — CBC
HEMATOCRIT: 41.3 % (ref 36.0–46.0)
HEMOGLOBIN: 13.7 g/dL (ref 12.0–15.0)
MCH: 27.2 pg (ref 26.0–34.0)
MCHC: 33.2 g/dL (ref 30.0–36.0)
MCV: 82.1 fL (ref 78.0–100.0)
Platelets: 361 10*3/uL (ref 150–400)
RBC: 5.03 MIL/uL (ref 3.87–5.11)
RDW: 15 % (ref 11.5–15.5)
WBC: 10.2 10*3/uL (ref 4.0–10.5)

## 2014-12-20 LAB — COMPREHENSIVE METABOLIC PANEL
ALBUMIN: 4.1 g/dL (ref 3.5–5.0)
ALT: 21 U/L (ref 14–54)
ANION GAP: 11 (ref 5–15)
AST: 18 U/L (ref 15–41)
Alkaline Phosphatase: 65 U/L (ref 38–126)
BUN: 26 mg/dL — ABNORMAL HIGH (ref 6–20)
CHLORIDE: 94 mmol/L — AB (ref 101–111)
CO2: 28 mmol/L (ref 22–32)
CREATININE: 1.38 mg/dL — AB (ref 0.44–1.00)
Calcium: 9.6 mg/dL (ref 8.9–10.3)
GFR calc Af Amer: 46 mL/min — ABNORMAL LOW (ref >60)
GFR calc non Af Amer: 40 mL/min — ABNORMAL LOW (ref >60)
Glucose, Bld: 103 mg/dL — ABNORMAL HIGH (ref 65–99)
Potassium: 3.8 mmol/L (ref 3.5–5.1)
Sodium: 133 mmol/L — ABNORMAL LOW (ref 135–145)
TOTAL PROTEIN: 7.8 g/dL (ref 6.5–8.1)
Total Bilirubin: 0.5 mg/dL (ref 0.3–1.2)

## 2014-12-20 LAB — TROPONIN I: Troponin I: <0.03 ng/mL (ref <0.031)

## 2014-12-20 LAB — LIPASE, BLOOD: Lipase: 16 U/L — ABNORMAL LOW (ref 22–51)

## 2014-12-20 MED ORDER — SUCRALFATE 1 G PO TABS: 1.0000 g | ORAL_TABLET | Freq: Three times a day (TID) | ORAL | Status: DC

## 2014-12-20 MED ORDER — PANTOPRAZOLE SODIUM 40 MG IV SOLR
40.0000 mg | INTRAVENOUS | Status: AC
  Administered 2014-12-20: 40 mg via INTRAVENOUS
  Filled 2014-12-20: qty 40

## 2014-12-20 MED ORDER — ONDANSETRON HCL 4 MG/2ML IJ SOLN
4.0000 mg | Freq: Once | INTRAMUSCULAR | Status: AC
  Administered 2014-12-20: 4 mg via INTRAVENOUS
  Filled 2014-12-20: qty 2

## 2014-12-20 MED ORDER — GI COCKTAIL ~~LOC~~
30.0000 mL | Freq: Once | ORAL | Status: AC
  Administered 2014-12-20: 30 mL via ORAL
  Filled 2014-12-20: qty 30

## 2014-12-20 MED ORDER — FAMOTIDINE 20 MG PO TABS: 20.0000 mg | ORAL_TABLET | Freq: Two times a day (BID) | ORAL | Status: DC

## 2014-12-20 MED ORDER — SODIUM CHLORIDE 0.9 % IV BOLUS (SEPSIS)
1000.0000 mL | Freq: Once | INTRAVENOUS | Status: AC
  Administered 2014-12-20: 1000 mL via INTRAVENOUS

## 2014-12-20 MED ORDER — SODIUM CHLORIDE 0.9 % IV BOLUS (SEPSIS): 1000.0000 mL | Freq: Once | INTRAVENOUS | Status: DC

## 2014-12-20 MED ORDER — OMEPRAZOLE 20 MG PO CPDR: 20.0000 mg | DELAYED_RELEASE_CAPSULE | Freq: Every day | ORAL | Status: DC

## 2014-12-20 MED ORDER — FAMOTIDINE IN NACL 20-0.9 MG/50ML-% IV SOLN
20.0000 mg | INTRAVENOUS | Status: AC
  Administered 2014-12-20: 20 mg via INTRAVENOUS
  Filled 2014-12-20: qty 50

## 2014-12-20 NOTE — ED Notes (Signed)
Patient insisted that the IV Team was the only people who could start her IV. Attempted x 2 .

## 2014-12-20 NOTE — ED Notes (Signed)
Per EMS- patient c/i generalized abdominal pain, nausea, and patient vomited x 1 this AM. Patient  also reports that she had a black, tarry stool x 3 days and prior to that she did not have a BM for 3 weeks

## 2014-12-20 NOTE — ED Provider Notes (Signed)
CSN: [id number]     Arrival date & time 12/20/14  1558 History   First MD Initiated Contact with Patient 12/20/14 1604     Chief Complaint  Patient presents with  . Abdominal Pain     (Consider location/radiation/quality/duration/timing/severity/associated sxs/prior Treatment) HPI Comments: The patient is a 64 year old female, she has a history of a total abdominal hysterectomy but no other abdominal surgery. She has had an abdominal discomfort for approximately the last 3 weeks, she associates this with not having any bowel movements, she describes the pain as epigastric, severe, persistent, daily, present at night as well, not associated with chest pain shortness of breath fevers coughing or swelling of the legs. She has no dysuria or hematuria, she does have vomiting after eating, she states that she has never had this pain until the last 10 days, she has never had an endoscopy, she has acid reflux but takes no medications for it. She does use daily 800 mg tablets of ibuprofen for her pain. She tried to drink a whole bottle of Pepto-Bismol and noticed that her stool was very black after she did that, it has since normalized.  Over the last 10 days she has had 2 separate visits, acute abdominal series and CT scan imaging has been ordered, these were unremarkable and showed no evidence of any pathologic problems, blood work and urinalysis were also normal.  Patient is a 64 y.o. female presenting with abdominal pain. The history is provided by the patient.  Abdominal Pain   Past Medical History  Diagnosis Date  . Asthma   . Arthritis   . Bursitis   . Bronchitis   . Diabetes mellitus without complication   . Acid reflux   . Hypertension    Past Surgical History  Procedure Laterality Date  . Abdominal hysterectomy     History reviewed. No pertinent family history. History  Substance Use Topics  . Smoking status: Former Smoker -- 0.25 packs/day  . Smokeless tobacco: Never Used  .  Alcohol Use: No   OB History    No data available     Review of Systems  Gastrointestinal: Positive for abdominal pain.  All other systems reviewed and are negative.     Allergies  Aspirin  Home Medications   Prior to Admission medications   Medication Sig Start Date End Date Taking? Authorizing Provider  albuterol (PROVENTIL HFA;VENTOLIN HFA) 108 (90 BASE) MCG/ACT inhaler Inhale 2 puffs into the lungs every 6 (six) hours as needed. For shortness of breath.   Yes Historical Provider, MD  cetirizine (ZYRTEC) 10 MG tablet Take 10 mg by mouth daily as needed for allergies.   Yes Historical Provider, MD  Fluticasone-Salmeterol (ADVAIR DISKUS IN) Inhale 2 puffs into the lungs daily as needed (shortness of breathe).   Yes Historical Provider, MD  gabapentin (NEURONTIN) 400 MG capsule Take 400 mg by mouth 2 (two) times daily.    Yes Historical Provider, MD  ibuprofen (ADVIL,MOTRIN) 800 MG tablet Take 800 mg by mouth 2 (two) times daily. For pain.   Yes Historical Provider, MD  lisinopril-hydrochlorothiazide (PRINZIDE,ZESTORETIC) 20-25 MG per tablet Take 1 tablet by mouth daily.   Yes Historical Provider, MD  metFORMIN (GLUCOPHAGE) 500 MG tablet Take 250 mg by mouth daily with breakfast.   Yes Historical Provider, MD  polyethylene glycol powder (GLYCOLAX/MIRALAX) powder Take 17 g by mouth daily. 12/17/14  Yes Delsa Grana, PA-C  PRESCRIPTION MEDICATION Acid reflux medication   Yes Historical Provider, MD  promethazine (PHENERGAN) 25  MG tablet Take 1 tablet (25 mg total) by mouth every 6 (six) hours as needed for nausea or vomiting. 12/12/14  Yes Tatyana Kirichenko, PA-C  sulfamethoxazole-trimethoprim (BACTRIM DS,SEPTRA DS) 800-160 MG per tablet Take 1 tablet by mouth 2 (two) times daily. 12/13/14 12/20/14 Yes Virgel Manifold, MD  traMADol (ULTRAM) 50 MG tablet Take 1 tablet (50 mg total) by mouth every 6 (six) hours as needed for pain. 02/06/13  Yes Clayton Bibles, PA-C  traZODone (DESYREL) 50 MG tablet Take  50 mg by mouth at bedtime.   Yes Historical Provider, MD  white petrolatum ointment Apply gently with cotton swab to inside of nostrils daily as needed for nasal dryness. 12/19/12  Yes John Molpus, MD  famotidine (PEPCID) 20 MG tablet Take 1 tablet (20 mg total) by mouth 2 (two) times daily. 12/20/14   Noemi Chapel, MD  omeprazole (PRILOSEC) 20 MG capsule Take 1 capsule (20 mg total) by mouth daily. 12/20/14   Noemi Chapel, MD  sucralfate (CARAFATE) 1 G tablet Take 1 tablet (1 g total) by mouth 4 (four) times daily -  with meals and at bedtime. 12/20/14   Noemi Chapel, MD   BP 120/62 mmHg  Pulse 74  Temp(Src) 99 F (37.2 C) (Oral)  Resp 15  SpO2 98% Physical Exam  Constitutional: She appears well-developed and well-nourished. No distress.  HENT:  Head: Normocephalic and atraumatic.  Mouth/Throat: Oropharynx is clear and moist. No oropharyngeal exudate.  Eyes: Conjunctivae and EOM are normal. Pupils are equal, round, and reactive to light. Right eye exhibits no discharge. Left eye exhibits no discharge. No scleral icterus.  Neck: Normal range of motion. Neck supple. No JVD present. No thyromegaly present.  Cardiovascular: Normal rate, regular rhythm, normal heart sounds and intact distal pulses.  Exam reveals no gallop and no friction rub.   No murmur heard. Pulmonary/Chest: Effort normal and breath sounds normal. No respiratory distress. She has no wheezes. She has no rales.  Abdominal: Soft. Bowel sounds are normal. She exhibits no distension and no mass. There is tenderness ( Mild abdominal discomfort to palpation in the epigastrium and right upper quadrant, no significant tenderness in the lower abdomen, no guarding or peritoneal signs).  Musculoskeletal: Normal range of motion. She exhibits no edema or tenderness.  Lymphadenopathy:    She has no cervical adenopathy.  Neurological: She is alert. Coordination normal.  Skin: Skin is warm and dry. No rash noted. No erythema.  Psychiatric: She has  a normal mood and affect. Her behavior is normal.  Nursing note and vitals reviewed.   ED Course  Procedures (including critical care time) Labs Review Labs Reviewed  COMPREHENSIVE METABOLIC PANEL - Abnormal; Notable for the following:    Sodium 133 (*)    Chloride 94 (*)    Glucose, Bld 103 (*)    BUN 26 (*)    Creatinine, Ser 1.38 (*)    GFR calc non Af Amer 40 (*)    GFR calc Af Amer 46 (*)    All other components within normal limits  LIPASE, BLOOD - Abnormal; Notable for the following:    Lipase 16 (*)    All other components within normal limits  CBC  TROPONIN I    Imaging Review US Abdomen Complete  12/20/2014   CLINICAL DATA:  Generalized abdominal pain with nausea and vomiting for several weeks.  EXAM: ULTRASOUND ABDOMEN COMPLETE  COMPARISON:  CT on 12/12/2014  FINDINGS: Gallbladder: No gallstones or wall thickening visualized. No sonographic Murphy sign  noted.  Common bile duct: Diameter: 5 mm, within normal limits.  Liver: No focal lesion identified. Within normal limits in parenchymal echogenicity.  IVC: No abnormality visualized.  Pancreas: Visualized portion unremarkable.  Spleen: Size and appearance within normal limits.  Right Kidney: Length: 10.6 cm. Echogenicity within normal limits. Mild parenchymal scarring noted. No mass or hydronephrosis visualized.  Left Kidney: Length: 10.6 cm. Echogenicity within normal limits. Mild parenchymal scarring noted. No mass or hydronephrosis visualized.  Abdominal aorta: No aneurysm visualized.  Other findings: None.  IMPRESSION: No acute findings. No evidence of cholelithiasis, biliary dilatation, or hydronephrosis.   Electronically Signed   By: Earle Gell M.D.   On: 12/20/2014 18:16     EKG Interpretation   Date/Time:  Thursday December 20 2014 16:44:19 EDT Ventricular Rate:  80 PR Interval:  131 QRS Duration: 97 QT Interval:  382 QTC Calculation: 441 R Axis:   54 Text Interpretation:  Sinus rhythm mild diffuse ST elevation no  reciprocal  changes Abnormal ekg No old tracing to compare Confirmed by Kiara Mcdowell  MD,  [place] ([id number]) on 12/20/2014 4:49:24 PM      MDM   Final diagnoses:  Abdominal pain  PUD (peptic ulcer disease)    Vital signs are unremarkable, abdominal exam is consistent with peptic ulcer disease, would also consider potentially cholecystitis though the patient has had normal imaging multiple visits and normal labs. We'll repeat labs and treat for peptic ulcer disease with medications including GI cocktail, Pepcid and Protonix, right upper quadrant ultrasound to fully evaluate the gallbladder has CT scan imaging is less sensitive.  She has improved significantly after the medications including a GI cocktail, see the medications below, I have recommended outpatient follow-up as her labs look normal, ultrasound looks normal, the patient is aware of her results and the indications for return as well as the need for close follow-up, GI phone number given.  Additionally EKG and troponin unremarkable.  Meds given in ED:  Medications  sodium chloride 0.9 % bolus 1,000 mL (not administered)  sodium chloride 0.9 % bolus 1,000 mL (0 mLs Intravenous Stopped 12/20/14 1911)  famotidine (PEPCID) IVPB 20 mg premix (0 mg Intravenous Stopped 12/20/14 1911)  pantoprazole (PROTONIX) injection 40 mg (40 mg Intravenous Given 12/20/14 1806)  gi cocktail (Maalox,Lidocaine,Donnatal) (30 mLs Oral Given 12/20/14 1636)  ondansetron (ZOFRAN) injection 4 mg (4 mg Intravenous Given 12/20/14 1807)    New Prescriptions   FAMOTIDINE (PEPCID) 20 MG TABLET    Take 1 tablet (20 mg total) by mouth 2 (two) times daily.   OMEPRAZOLE (PRILOSEC) 20 MG CAPSULE    Take 1 capsule (20 mg total) by mouth daily.   SUCRALFATE (CARAFATE) 1 G TABLET    Take 1 tablet (1 g total) by mouth 4 (four) times daily -  with meals and at bedtime.      Noemi Chapel, MD 12/20/14 Lurena Nida

## 2014-12-20 NOTE — ED Notes (Signed)
Patient given gingerale to drink for PO challenge before D/C per provider

## 2014-12-20 NOTE — Discharge Instructions (Signed)
Please call your doctor for a followup appointment within 24-48 hours. When you talk to your doctor please let them know that you were seen in the emergency department and have them acquire all of your records so that they can discuss the findings with you and formulate a treatment plan to fully care for your new and ongoing problems. ° °

## 2016-05-03 ENCOUNTER — Encounter (HOSPITAL_COMMUNITY): Payer: Self-pay | Admitting: *Deleted

## 2016-05-03 ENCOUNTER — Emergency Department (HOSPITAL_COMMUNITY)
Admission: EM | Admit: 2016-05-03 | Discharge: 2016-05-03 | Disposition: A | Discharge status: Home / Self Care | Payer: Medicare HMO | Attending: Emergency Medicine | Admitting: Emergency Medicine

## 2016-05-03 DIAGNOSIS — Z7984 Long term (current) use of oral hypoglycemic drugs: Secondary | ICD-10-CM | POA: Diagnosis not present

## 2016-05-03 DIAGNOSIS — J45909 Unspecified asthma, uncomplicated: Secondary | ICD-10-CM | POA: Diagnosis not present

## 2016-05-03 DIAGNOSIS — I10 Essential (primary) hypertension: Secondary | ICD-10-CM | POA: Diagnosis not present

## 2016-05-03 DIAGNOSIS — K0889 Other specified disorders of teeth and supporting structures: Secondary | ICD-10-CM | POA: Insufficient documentation

## 2016-05-03 DIAGNOSIS — E119 Type 2 diabetes mellitus without complications: Secondary | ICD-10-CM | POA: Insufficient documentation

## 2016-05-03 DIAGNOSIS — Z87891 Personal history of nicotine dependence: Secondary | ICD-10-CM | POA: Diagnosis not present

## 2016-05-03 DIAGNOSIS — Z79899 Other long term (current) drug therapy: Secondary | ICD-10-CM | POA: Diagnosis not present

## 2016-05-03 MED ORDER — OXYCODONE-ACETAMINOPHEN 5-325 MG PO TABS: 1.0000 | ORAL_TABLET | Freq: Four times a day (QID) | ORAL | 0 refills | Status: DC | PRN

## 2016-05-03 MED ORDER — PENICILLIN V POTASSIUM 500 MG PO TABS: 500.0000 mg | ORAL_TABLET | Freq: Four times a day (QID) | ORAL | 0 refills | Status: AC

## 2016-05-03 MED ORDER — OXYCODONE-ACETAMINOPHEN 5-325 MG PO TABS
2.0000 | ORAL_TABLET | Freq: Once | ORAL | Status: AC
Start: 2016-05-03 — End: 2016-05-03
  Administered 2016-05-03: 2 via ORAL
  Filled 2016-05-03: qty 2

## 2016-05-03 MED ORDER — PENICILLIN V POTASSIUM 500 MG PO TABS
500.0000 mg | ORAL_TABLET | Freq: Once | ORAL | Status: AC
  Administered 2016-05-03: 500 mg via ORAL
  Filled 2016-05-03: qty 1

## 2016-05-03 NOTE — Discharge Instructions (Signed)
Medications: Penicillin, Percocet  Treatment: Take penicillin 4 times daily for 1 week. Make sure you finish all this medication. Take 1-2 Percocet every 4-6 hours as needed for severe pain. Do not drive or operate machinery when taking this medication and only take as prescribed.  Follow-up: Please follow-up with a dentist as soon as possible by calling the numbers below and attached to your paperwork. Please return to emergency department if you develop any new or worsening symptoms.  In addition to the attached resources: [facility]  [address]  [place], [place] [address]  Phone [phone]

## 2016-05-03 NOTE — ED Triage Notes (Signed)
Pt complains of pain around right upper molar radiating to cheekbone for the past 2 days. Pt has tried 800mg  ibuprofen with no relief.

## 2016-05-03 NOTE — ED Provider Notes (Signed)
[facility] DEPT Provider Note   CSN: [id number] Arrival date & time: 05/03/16  1831  By signing my name below, I, Maren Reamer, attest that this documentation has been prepared under the direction and in the presence of Eliezer Mccoy, PA-C. Electronically Signed: Maren Reamer, Scribe. 05/03/16. 10:20 AM.   History   Chief Complaint Chief Complaint  Patient presents with  . Dental Pain   The history is provided by the patient. No language interpreter was used.  Dental Pain      HPI Comments: Barbara Wilcox is a 65 y.o. female who presents to the Emergency Department complaining of upper right dental pain radiating to her cheekbone starting 2 days ago. She took 800mg  Ibuprofen and Tramadol with no pain relief. Her friend gave her some Penicillin as well. Patient is very tearful about the situation and says she cannot afford a dentist. Had one episode of nausea and vomiting earlier today and this has resolved. Notes she used some OraGel and gauze in the area of pain 2 days ago. Denies fever. Has not eaten since started due to the pain.   Past Medical History:  Diagnosis Date  . Acid reflux   . Arthritis   . Asthma   . Bronchitis   . Bursitis   . Diabetes mellitus without complication ([place])   . Hypertension     Patient Active Problem List   Diagnosis Date Noted  . Epistaxis 12/12/2014  . TOBACCO USER 05/02/2007  . ASTHMA 03/21/2007  . GERD 03/21/2007  . MENOPAUSE-RELATED VASOMOTOR SYMPTOMS, HOT FLASHES 03/21/2007    Past Surgical History:  Procedure Laterality Date  . ABDOMINAL HYSTERECTOMY      OB History    No data available       Home Medications    Prior to Admission medications   Medication Sig Start Date End Date Taking? Authorizing Provider  albuterol (PROVENTIL HFA;VENTOLIN HFA) 108 (90 BASE) MCG/ACT inhaler Inhale 2 puffs into the lungs every 6 (six) hours as needed. For shortness of breath.    Historical Provider, MD  cetirizine (ZYRTEC) 10 MG  tablet Take 10 mg by mouth daily as needed for allergies.    Historical Provider, MD  famotidine (PEPCID) 20 MG tablet Take 1 tablet (20 mg total) by mouth 2 (two) times daily. 12/20/14   Noemi Chapel, MD  Fluticasone-Salmeterol (ADVAIR DISKUS IN) Inhale 2 puffs into the lungs daily as needed (shortness of breathe).    Historical Provider, MD  gabapentin (NEURONTIN) 400 MG capsule Take 400 mg by mouth 2 (two) times daily.     Historical Provider, MD  ibuprofen (ADVIL,MOTRIN) 800 MG tablet Take 800 mg by mouth 2 (two) times daily. For pain.    Historical Provider, MD  lisinopril-hydrochlorothiazide (PRINZIDE,ZESTORETIC) 20-25 MG per tablet Take 1 tablet by mouth daily.    Historical Provider, MD  metFORMIN (GLUCOPHAGE) 500 MG tablet Take 250 mg by mouth daily with breakfast.    Historical Provider, MD  omeprazole (PRILOSEC) 20 MG capsule Take 1 capsule (20 mg total) by mouth daily. 12/20/14   Noemi Chapel, MD  oxyCODONE-acetaminophen (PERCOCET/ROXICET) 5-325 MG tablet Take 1-2 tablets by mouth every 6 (six) hours as needed for severe pain. 05/03/16   Frederica Kuster, PA-C  penicillin v potassium (VEETID) 500 MG tablet Take 1 tablet (500 mg total) by mouth 4 (four) times daily. 05/03/16 05/10/16  Bea Graff Emmamae Mcnamara, PA-C  polyethylene glycol powder (GLYCOLAX/MIRALAX) powder Take 17 g by mouth daily. 12/17/14   Delsa Grana, PA-C  PRESCRIPTION MEDICATION Acid reflux medication    Historical Provider, MD  promethazine (PHENERGAN) 25 MG tablet Take 1 tablet (25 mg total) by mouth every 6 (six) hours as needed for nausea or vomiting. 12/12/14   Tatyana Kirichenko, PA-C  sucralfate (CARAFATE) 1 G tablet Take 1 tablet (1 g total) by mouth 4 (four) times daily -  with meals and at bedtime. 12/20/14   Noemi Chapel, MD  traMADol (ULTRAM) 50 MG tablet Take 1 tablet (50 mg total) by mouth every 6 (six) hours as needed for pain. 02/06/13   Clayton Bibles, PA-C  traZODone (DESYREL) 50 MG tablet Take 50 mg by mouth at bedtime.     Historical Provider, MD  white petrolatum ointment Apply gently with cotton swab to inside of nostrils daily as needed for nasal dryness. 12/19/12   Shanon Rosser, MD    Family History No family history on file.  Social History Social History  Substance Use Topics  . Smoking status: Former Smoker    Packs/day: 0.25  . Smokeless tobacco: Never Used  . Alcohol use No     Allergies   Aspirin   Review of Systems Review of Systems  Constitutional: Negative for chills and fever.  HENT: Negative for facial swelling and sore throat.        Dental pain, upper right  Respiratory: Negative for chest tightness and shortness of breath.   Cardiovascular: Negative for chest pain.  Gastrointestinal: Negative for abdominal pain, nausea and vomiting.  Genitourinary: Negative for dysuria.  Musculoskeletal: Negative for back pain.  Skin: Negative for rash and wound.  Neurological: Negative for headaches.  Psychiatric/Behavioral: The patient is not nervous/anxious.      Physical Exam Updated Vital Signs BP 119/79 (BP Location: Right Arm)   Pulse 112   Temp 100 F (37.8 C) (Oral)   Resp 18   SpO2 99%   Physical Exam  Constitutional: She appears well-developed and well-nourished. No distress.  HENT:  Head: Normocephalic and atraumatic.  Mouth/Throat: Oropharynx is clear and moist. No trismus in the jaw. Abnormal dentition. Dental caries present. No dental abscesses. No oropharyngeal exudate, posterior oropharyngeal edema, posterior oropharyngeal erythema or tonsillar abscesses.    Eyes: Conjunctivae are normal. Pupils are equal, round, and reactive to light. Right eye exhibits no discharge. Left eye exhibits no discharge. No scleral icterus.  Neck: Normal range of motion. Neck supple. No thyromegaly present.  Cardiovascular: Normal rate, regular rhythm, normal heart sounds and intact distal pulses.  Exam reveals no gallop and no friction rub.   No murmur heard. Pulmonary/Chest: Effort  normal and breath sounds normal. No stridor. No respiratory distress. She has no wheezes. She has no rales.  Abdominal: Soft. Bowel sounds are normal. She exhibits no distension. There is no tenderness. There is no rebound and no guarding.  Musculoskeletal: She exhibits no edema.  Lymphadenopathy:    She has no cervical adenopathy.  Neurological: She is alert. Coordination normal.  Skin: Skin is warm and dry. No rash noted. She is not diaphoretic. No pallor.  Psychiatric: She exhibits a depressed mood (tearful).  Nursing note and vitals reviewed.    ED Treatments / Results  Labs (all labs ordered are listed, but only abnormal results are displayed) Labs Reviewed - No data to display  EKG  EKG Interpretation None       Radiology No results found.  Procedures Procedures (including critical care time)  Medications Ordered in ED Medications  oxyCODONE-acetaminophen (PERCOCET/ROXICET) 5-325 MG per tablet 2 tablet (  2 tablets Oral Given 05/03/16 1933)  penicillin v potassium (VEETID) tablet 500 mg (500 mg Oral Given 05/03/16 2007)     Initial Impression / Assessment and Plan / ED Course  I have reviewed the triage vital signs and the nursing notes.  Pertinent labs & imaging results that were available during my care of the patient were reviewed by me and considered in my medical decision making (see chart for details).  Clinical Course   Patient with dentalgia.  No abscess immediate incision and drainage.  Exam not concrequiring erning for Ludwig's angina or pharyngeal abscess. Provided Percocet for pain relief. Will treat with Penicillin instructed to follow-up with dentist. Provided resources for finding an affordable dentist. Discussed return precautions. Pt safe for discharge.   I personally performed the services described in this documentation, which was scribed in my presence. The recorded information has been reviewed and is accurate.   Final Clinical Impressions(s)  / ED Diagnoses   Final diagnoses:  Pain, dental    New Prescriptions Discharge Medication List as of 05/03/2016  7:58 PM    START taking these medications   Details  oxyCODONE-acetaminophen (PERCOCET/ROXICET) 5-325 MG tablet Take 1-2 tablets by mouth every 6 (six) hours as needed for severe pain., Starting Sun 05/03/2016, Print    penicillin v potassium (VEETID) 500 MG tablet Take 1 tablet (500 mg total) by mouth 4 (four) times daily., Starting Sun 05/03/2016, Until Sun 05/10/2016, Print         Frederica Kuster, PA-C 05/04/16 [place], MD 05/08/16 [phone]

## 2017-10-01 ENCOUNTER — Other Ambulatory Visit: Payer: Self-pay | Admitting: Primary Care

## 2017-10-01 DIAGNOSIS — Z139 Encounter for screening, unspecified: Secondary | ICD-10-CM

## 2017-10-20 ENCOUNTER — Ambulatory Visit
Admission: RE | Admit: 2017-10-20 | Discharge: 2017-10-20 | Disposition: A | Discharge status: Home / Self Care | Payer: Medicare Other | Source: Ambulatory Visit | Attending: Primary Care | Admitting: Primary Care

## 2017-10-20 DIAGNOSIS — Z139 Encounter for screening, unspecified: Secondary | ICD-10-CM

## 2018-01-19 ENCOUNTER — Emergency Department (HOSPITAL_COMMUNITY)
Admission: EM | Admit: 2018-01-19 | Discharge: 2018-01-19 | Disposition: A | Discharge status: Home / Self Care | Payer: Medicare Other | Attending: Emergency Medicine | Admitting: Emergency Medicine

## 2018-01-19 ENCOUNTER — Encounter (HOSPITAL_COMMUNITY): Payer: Self-pay | Admitting: Emergency Medicine

## 2018-01-19 ENCOUNTER — Other Ambulatory Visit: Payer: Self-pay

## 2018-01-19 DIAGNOSIS — Y93E2 Activity, laundry: Secondary | ICD-10-CM | POA: Diagnosis not present

## 2018-01-19 DIAGNOSIS — I10 Essential (primary) hypertension: Secondary | ICD-10-CM | POA: Diagnosis not present

## 2018-01-19 DIAGNOSIS — X500XXA Overexertion from strenuous movement or load, initial encounter: Secondary | ICD-10-CM | POA: Insufficient documentation

## 2018-01-19 DIAGNOSIS — S3991XA Unspecified injury of abdomen, initial encounter: Secondary | ICD-10-CM | POA: Diagnosis present

## 2018-01-19 DIAGNOSIS — Z87891 Personal history of nicotine dependence: Secondary | ICD-10-CM | POA: Insufficient documentation

## 2018-01-19 DIAGNOSIS — S39011A Strain of muscle, fascia and tendon of abdomen, initial encounter: Secondary | ICD-10-CM | POA: Diagnosis not present

## 2018-01-19 DIAGNOSIS — J40 Bronchitis, not specified as acute or chronic: Secondary | ICD-10-CM | POA: Insufficient documentation

## 2018-01-19 DIAGNOSIS — Y999 Unspecified external cause status: Secondary | ICD-10-CM | POA: Insufficient documentation

## 2018-01-19 DIAGNOSIS — Y929 Unspecified place or not applicable: Secondary | ICD-10-CM | POA: Insufficient documentation

## 2018-01-19 DIAGNOSIS — Z79899 Other long term (current) drug therapy: Secondary | ICD-10-CM | POA: Diagnosis not present

## 2018-01-19 DIAGNOSIS — E119 Type 2 diabetes mellitus without complications: Secondary | ICD-10-CM | POA: Diagnosis not present

## 2018-01-19 LAB — URINALYSIS, ROUTINE W REFLEX MICROSCOPIC
BILIRUBIN URINE: NEGATIVE
Glucose, UA: NEGATIVE mg/dL
Hgb urine dipstick: NEGATIVE
Ketones, ur: NEGATIVE mg/dL
Leukocytes, UA: NEGATIVE
NITRITE: NEGATIVE
PROTEIN: NEGATIVE mg/dL
Specific Gravity, Urine: 1.02 (ref 1.005–1.030)
pH: 5 (ref 5.0–8.0)

## 2018-01-19 MED ORDER — AZITHROMYCIN 250 MG PO TABS: ORAL_TABLET | ORAL | 0 refills | Status: DC

## 2018-01-19 NOTE — ED Provider Notes (Signed)
[facility] EMERGENCY DEPARTMENT Provider Note   CSN: [id number] Arrival date & time: 01/19/18  0932  Time seen 12:52 AM   History   Chief Complaint Chief Complaint  Patient presents with  . Flank Pain    HPI Barbara Wilcox is a 67 y.o. female.  HPI patient states she started getting pain in her left lateral abdomen on the evening of July 8.  She thinks maybe she picked up a heavy load of laundry earlier that day.  She states the pain is pleuritic it hurts when she breathes deep.  She states she smokes and she has worsening cough today.  Sometimes she coughs up mucus but does not look at it.  She denies fever.  She states she may be had some shortness of breath today however she has a history of asthma and was wheezing earlier today.  She states she has an inhaler but she did not use it today.  She denies any other injury.  She denies flank pain.  Patient states multiple times, I think I need a antibiotic".  When I asked her why she states that she had 2 teeth pulled "a long time ago" and sometimes her gum swells up.  I discussed with her that there are different antibiotics for the mouth and for respiratory and she was not aware of that.  She states she was just seen last week by her primary care doctor.  PCP Kerin Perna, NP   Past Medical History:  Diagnosis Date  . Acid reflux   . Arthritis   . Asthma   . Bronchitis   . Bursitis   . Diabetes mellitus without complication ([place])   . Hypertension     Patient Active Problem List   Diagnosis Date Noted  . Epistaxis 12/12/2014  . TOBACCO USER 05/02/2007  . ASTHMA 03/21/2007  . GERD 03/21/2007  . MENOPAUSE-RELATED VASOMOTOR SYMPTOMS, HOT FLASHES 03/21/2007    Past Surgical History:  Procedure Laterality Date  . ABDOMINAL HYSTERECTOMY       OB History   None      Home Medications    Prior to Admission medications   Medication Sig Start Date End Date Taking? Authorizing Provider  albuterol (PROVENTIL  HFA;VENTOLIN HFA) 108 (90 BASE) MCG/ACT inhaler Inhale 2 puffs into the lungs every 6 (six) hours as needed. For shortness of breath.    [provider]  azithromycin (ZITHROMAX) 250 MG tablet Take 2 po the first day then once a day for the next 4 days. 01/19/18   Rolland Porter, MD  cetirizine (ZYRTEC) 10 MG tablet Take 10 mg by mouth daily as needed for allergies.    [provider]  famotidine (PEPCID) 20 MG tablet Take 1 tablet (20 mg total) by mouth 2 (two) times daily. 12/20/14   Noemi Chapel, MD  Fluticasone-Salmeterol (ADVAIR DISKUS IN) Inhale 2 puffs into the lungs daily as needed (shortness of breathe).    [provider]  gabapentin (NEURONTIN) 400 MG capsule Take 400 mg by mouth 2 (two) times daily.     [provider]  ibuprofen (ADVIL,MOTRIN) 800 MG tablet Take 800 mg by mouth 2 (two) times daily. For pain.    [provider]  lisinopril-hydrochlorothiazide (PRINZIDE,ZESTORETIC) 20-25 MG per tablet Take 1 tablet by mouth daily.    [provider]  metFORMIN (GLUCOPHAGE) 500 MG tablet Take 250 mg by mouth daily with breakfast.    [provider]  omeprazole (PRILOSEC) 20 MG capsule Take  1 capsule (20 mg total) by mouth daily. 12/20/14   Noemi Chapel, MD  oxyCODONE-acetaminophen (PERCOCET/ROXICET) 5-325 MG tablet Take 1-2 tablets by mouth every 6 (six) hours as needed for severe pain. 05/03/16   Law, Bea Graff, PA-C  polyethylene glycol powder (GLYCOLAX/MIRALAX) powder Take 17 g by mouth daily. 12/17/14   Delsa Grana, PA-C  PRESCRIPTION MEDICATION Acid reflux medication    [provider]  promethazine (PHENERGAN) 25 MG tablet Take 1 tablet (25 mg total) by mouth every 6 (six) hours as needed for nausea or vomiting. 12/12/14   Kirichenko, Tatyana, PA-C  sucralfate (CARAFATE) 1 G tablet Take 1 tablet (1 g total) by mouth 4 (four) times daily -  with meals and at bedtime. 12/20/14   Noemi Chapel, MD  traMADol (ULTRAM) 50 MG  tablet Take 1 tablet (50 mg total) by mouth every 6 (six) hours as needed for pain. 02/06/13   Clayton Bibles, PA-C  traZODone (DESYREL) 50 MG tablet Take 50 mg by mouth at bedtime.    [provider]  white petrolatum ointment Apply gently with cotton swab to inside of nostrils daily as needed for nasal dryness. 12/19/12   Molpus, John, MD    Family History No family history on file.  Social History Social History   Tobacco Use  . Smoking status: Former Smoker    Packs/day: 0.25  . Smokeless tobacco: Never Used  Substance Use Topics  . Alcohol use: No  . Drug use: Yes    Types: Marijuana    Comment: occasionally  retired   Allergies   Aspirin   Review of Systems Review of Systems  All other systems reviewed and are negative.    Physical Exam Updated Vital Signs BP 109/67 (BP Location: Right Arm)   Pulse 89   Temp 97.7 F (36.5 C) (Oral)   Resp 17   Wt 75.8 kg (167 lb)   SpO2 99%   BMI 29.58 kg/m   Physical Exam  Constitutional: She is oriented to person, place, and time. She appears well-developed and well-nourished.  Non-toxic appearance. She does not appear ill. No distress.  HENT:  Head: Normocephalic and atraumatic.  Right Ear: External ear normal.  Left Ear: External ear normal.  Nose: Nose normal. No mucosal edema or rhinorrhea.  Mouth/Throat: Oropharynx is clear and moist and mucous membranes are normal. No dental abscesses or uvula swelling.  Eyes: Pupils are equal, round, and reactive to light. Conjunctivae and EOM are normal.  Neck: Normal range of motion and full passive range of motion without pain. Neck supple.  Cardiovascular: Normal rate, regular rhythm and normal heart sounds. Exam reveals no gallop and no friction rub.  No murmur heard. Pulmonary/Chest: Effort normal and breath sounds normal. No respiratory distress. She has no wheezes. She has no rhonchi. She has no rales. She exhibits no tenderness and no crepitus.  Patient is not  wheezing now, she is able to talk in complete sentences and does not appear to be short of breath.  Abdominal: Soft. Normal appearance and bowel sounds are normal. She exhibits no distension. There is no tenderness. There is no rebound and no guarding.    Patient's pain is in the lateral left abdomen between the rib cage in the superior iliac crest.  It is tender to palpation and when she breathes deep she grabs that area.  She does not have flank tenderness.  Musculoskeletal: Normal range of motion. She exhibits no edema or tenderness.  Moves all extremities well.  Neurological: She is alert and oriented to person, place, and time. She has normal strength. No cranial nerve deficit.  Skin: Skin is warm, dry and intact. No rash noted. No erythema. No pallor.  Psychiatric: She has a normal mood and affect. Her speech is normal and behavior is normal. Her mood appears not anxious.  Nursing note and vitals reviewed.    ED Treatments / Results  Labs (all labs ordered are listed, but only abnormal results are displayed) Results for orders placed or performed during the hospital encounter of 01/19/18  Urinalysis, Routine w reflex microscopic  Result Value Ref Range   Color, Urine YELLOW YELLOW   APPearance CLEAR CLEAR   Specific Gravity, Urine 1.020 1.005 - 1.030   pH 5.0 5.0 - 8.0   Glucose, UA NEGATIVE NEGATIVE mg/dL   Hgb urine dipstick NEGATIVE NEGATIVE   Bilirubin Urine NEGATIVE NEGATIVE   Ketones, ur NEGATIVE NEGATIVE mg/dL   Protein, ur NEGATIVE NEGATIVE mg/dL   Nitrite NEGATIVE NEGATIVE   Leukocytes, UA NEGATIVE NEGATIVE   Laboratory interpretation all normal     EKG None  Radiology No results found.  Procedures Procedures (including critical care time)  Medications Ordered in ED Medications - No data to display   Initial Impression / Assessment and Plan / ED Course  I have reviewed the triage vital signs and the nursing notes.  Pertinent labs & imaging results  that were available during my care of the patient were reviewed by me and considered in my medical decision making (see chart for details).     Patient appears to have some type of muscular strain possibly from coughing or from picking up a heavy load of laundry.  She does not want x-rays done.  Since she is a smoker she was discharged home on antibiotics.  She is not wheezing and does not appear to be short of breath, she was not given a nebulizer treatment.  Final Clinical Impressions(s) / ED Diagnoses   Final diagnoses:  Strain of abdominal muscle, initial encounter  Bronchitis    ED Discharge Orders        Ordered    azithromycin (ZITHROMAX) 250 MG tablet     01/19/18 0107     Plan discharge  Rolland Porter, MD, Barbette Or, MD 01/19/18 [phone]

## 2018-01-19 NOTE — ED Triage Notes (Signed)
Pt C/O left sided flank pain when she breathes deep. Pt states she has been "coughing hard today." Pt denies any urinary symptoms.

## 2018-01-19 NOTE — Discharge Instructions (Signed)
You can take ibuprofen 600 mg and/or acetaminophen 1000 mg every 6 hrs for pain as needed. Use an ice pack for comfort. Take the antibiotic until gone. Use mucinex DM OTC for cough.

## 2018-03-09 DIAGNOSIS — H5213 Myopia, bilateral: Secondary | ICD-10-CM | POA: Diagnosis not present

## 2018-03-16 DIAGNOSIS — M1990 Unspecified osteoarthritis, unspecified site: Secondary | ICD-10-CM | POA: Diagnosis not present

## 2018-03-16 DIAGNOSIS — Z79891 Long term (current) use of opiate analgesic: Secondary | ICD-10-CM | POA: Diagnosis not present

## 2018-03-16 DIAGNOSIS — E785 Hyperlipidemia, unspecified: Secondary | ICD-10-CM | POA: Diagnosis not present

## 2018-03-16 DIAGNOSIS — I10 Essential (primary) hypertension: Secondary | ICD-10-CM | POA: Diagnosis not present

## 2018-03-16 DIAGNOSIS — E119 Type 2 diabetes mellitus without complications: Secondary | ICD-10-CM | POA: Diagnosis not present

## 2018-05-29 ENCOUNTER — Encounter (HOSPITAL_COMMUNITY): Payer: Self-pay | Admitting: Emergency Medicine

## 2018-05-29 ENCOUNTER — Emergency Department (HOSPITAL_COMMUNITY): Payer: Medicare Other

## 2018-05-29 ENCOUNTER — Emergency Department (HOSPITAL_COMMUNITY)
Admission: EM | Admit: 2018-05-29 | Discharge: 2018-05-29 | Disposition: A | Discharge status: Home / Self Care | Payer: Medicare Other | Attending: Emergency Medicine | Admitting: Emergency Medicine

## 2018-05-29 DIAGNOSIS — F17200 Nicotine dependence, unspecified, uncomplicated: Secondary | ICD-10-CM | POA: Insufficient documentation

## 2018-05-29 DIAGNOSIS — J069 Acute upper respiratory infection, unspecified: Secondary | ICD-10-CM | POA: Diagnosis not present

## 2018-05-29 DIAGNOSIS — R05 Cough: Secondary | ICD-10-CM | POA: Diagnosis not present

## 2018-05-29 DIAGNOSIS — Z7984 Long term (current) use of oral hypoglycemic drugs: Secondary | ICD-10-CM | POA: Insufficient documentation

## 2018-05-29 DIAGNOSIS — Z79899 Other long term (current) drug therapy: Secondary | ICD-10-CM | POA: Diagnosis not present

## 2018-05-29 DIAGNOSIS — I10 Essential (primary) hypertension: Secondary | ICD-10-CM | POA: Diagnosis not present

## 2018-05-29 DIAGNOSIS — B9789 Other viral agents as the cause of diseases classified elsewhere: Secondary | ICD-10-CM

## 2018-05-29 DIAGNOSIS — E119 Type 2 diabetes mellitus without complications: Secondary | ICD-10-CM | POA: Diagnosis not present

## 2018-05-29 DIAGNOSIS — J45909 Unspecified asthma, uncomplicated: Secondary | ICD-10-CM | POA: Diagnosis not present

## 2018-05-29 MED ORDER — BENZONATATE 100 MG PO CAPS
200.0000 mg | ORAL_CAPSULE | Freq: Once | ORAL | Status: AC
  Administered 2018-05-29: 200 mg via ORAL
  Filled 2018-05-29: qty 2

## 2018-05-29 MED ORDER — CETIRIZINE HCL 10 MG PO TABS: 10.0000 mg | ORAL_TABLET | Freq: Every day | ORAL | 0 refills | Status: DC

## 2018-05-29 MED ORDER — BENZONATATE 100 MG PO CAPS: 200.0000 mg | ORAL_CAPSULE | Freq: Three times a day (TID) | ORAL | 0 refills | Status: DC | PRN

## 2018-05-29 NOTE — Discharge Instructions (Addendum)
You may continue taking the Tessalon Perles if this helps reduce your cough episodes.  You may continue using your over-the-counter allergy medication.  Rest to make sure you are drinking plenty of fluids.  Your chest x-ray is clear today with no evidence of a lung infection or bronchitis.  I suspect your symptoms will resolve on their own, however get rechecked for any worsening symptoms including shortness of breath, fevers or weakness.  You have been prescribed a new allergy medication to help you with the nasal drainage and postnasal drip.

## 2018-05-29 NOTE — ED Provider Notes (Addendum)
[facility] EMERGENCY DEPARTMENT Provider Note   CSN: [id number] Arrival date & time: 05/29/18  0846     History   Chief Complaint Chief Complaint  Patient presents with  . Cough    HPI Barbara Wilcox is a 67 y.o. female.  The history is provided by the patient.  Cough  This is a new problem. Episode onset: 4 days. The problem occurs every few minutes. The problem has not changed since onset.The cough is productive of sputum (Scant traces of clear to yellow sputum). There has been no fever. Associated symptoms include rhinorrhea. Pertinent negatives include no chest pain, no chills, no sweats, no ear congestion, no ear pain, no sore throat, no myalgias, no shortness of breath and no wheezing. Associated symptoms comments: Reports chronic sinus issues and uses an OTC antihistamine daily.  Cannot recall the name.. She has tried nothing for the symptoms. She is not a smoker. Her past medical history is significant for bronchitis and asthma. Her past medical history does not include pneumonia, COPD or emphysema.    Past Medical History:  Diagnosis Date  . Acid reflux   . Arthritis   . Asthma   . Bronchitis   . Bursitis   . Diabetes mellitus without complication ([place])   . Hypertension     Patient Active Problem List   Diagnosis Date Noted  . Epistaxis 12/12/2014  . TOBACCO USER 05/02/2007  . ASTHMA 03/21/2007  . GERD 03/21/2007  . MENOPAUSE-RELATED VASOMOTOR SYMPTOMS, HOT FLASHES 03/21/2007    Past Surgical History:  Procedure Laterality Date  . ABDOMINAL HYSTERECTOMY       OB History   None      Home Medications    Prior to Admission medications   Medication Sig Start Date End Date Taking? Authorizing Provider  albuterol (PROVENTIL HFA;VENTOLIN HFA) 108 (90 BASE) MCG/ACT inhaler Inhale 2 puffs into the lungs every 6 (six) hours as needed. For shortness of breath.   Yes [provider]  azithromycin (ZITHROMAX) 250 MG tablet Take 2 po the first day  then once a day for the next 4 days. 01/19/18  Yes Rolland Porter, MD  gabapentin (NEURONTIN) 400 MG capsule Take 400 mg by mouth 2 (two) times daily.    Yes [provider]  lisinopril-hydrochlorothiazide (PRINZIDE,ZESTORETIC) 10-12.5 MG tablet Take 1 tablet by mouth daily. 03/16/18  Yes [provider]  metFORMIN (GLUCOPHAGE) 500 MG tablet Take 250 mg by mouth daily with breakfast.   Yes [provider]  omeprazole (PRILOSEC) 20 MG capsule Take 1 capsule (20 mg total) by mouth daily. 12/20/14  Yes Noemi Chapel, MD  traZODone (DESYREL) 50 MG tablet Take 50 mg by mouth at bedtime.   Yes [provider]  benzonatate (TESSALON) 100 MG capsule Take 2 capsules (200 mg total) by mouth 3 (three) times daily as needed. 05/29/18   Evalee Jefferson, PA-C  white petrolatum ointment Apply gently with cotton swab to inside of nostrils daily as needed for nasal dryness. Patient not taking: Reported on 05/29/2018 12/19/12   Molpus, Jenny Reichmann, MD    Family History History reviewed. No pertinent family history.  Social History Social History   Tobacco Use  . Smoking status: Current Every Day Smoker    Packs/day: 0.50    Types: Cigarettes  . Smokeless tobacco: Never Used  Substance Use Topics  . Alcohol use: No  . Drug use: Yes    Types: Marijuana    Comment: occasionally     Allergies  Aspirin   Review of Systems Review of Systems  Constitutional: Negative for chills.  HENT: Positive for rhinorrhea. Negative for ear pain and sore throat.   Respiratory: Positive for cough. Negative for shortness of breath and wheezing.   Cardiovascular: Negative for chest pain.  Musculoskeletal: Negative for myalgias.     Physical Exam Updated Vital Signs BP 116/62 (BP Location: Right Arm)   Pulse 78   Temp (!) 97.5 F (36.4 C) (Oral)   Resp 18   Ht 5\' 3"  (1.6 m)   Wt 77.1 kg   SpO2 100%   BMI 30.11 kg/m   Physical Exam  Constitutional: She is oriented to person, place, and  time. She appears well-developed and well-nourished.  HENT:  Head: Normocephalic and atraumatic.  Right Ear: Tympanic membrane and ear canal normal.  Left Ear: Tympanic membrane and ear canal normal.  Nose: No mucosal edema or rhinorrhea.  Mouth/Throat: Uvula is midline, oropharynx is clear and moist and mucous membranes are normal. No oropharyngeal exudate, posterior oropharyngeal edema, posterior oropharyngeal erythema or tonsillar abscesses.  Frequent sniffling. No nasal dc.  Scant white PND noted.  Eyes: Conjunctivae are normal.  Cardiovascular: Normal rate and normal heart sounds.  Pulmonary/Chest: Effort normal. No respiratory distress. She has no wheezes. She has no rales.  Abdominal: Soft. There is no tenderness.  Musculoskeletal: Normal range of motion. She exhibits no edema.  Neurological: She is alert and oriented to person, place, and time.  Skin: Skin is warm and dry. No rash noted.  Psychiatric: She has a normal mood and affect.     ED Treatments / Results  Labs (all labs ordered are listed, but only abnormal results are displayed) Labs Reviewed - No data to display  EKG None  Radiology Dg Chest 2 View  Result Date: 05/29/2018 CLINICAL DATA:  Cough. EXAM: CHEST - 2 VIEW COMPARISON:  12/17/2014 FINDINGS: Both lungs are clear. Heart and mediastinum are within normal limits. Trachea is midline. No pleural effusions. Bone structures are unremarkable. IMPRESSION: No active cardiopulmonary disease. Electronically Signed   By: Markus Daft M.D.   On: 05/29/2018 10:07    Procedures Procedures (including critical care time)  Medications Ordered in ED Medications  benzonatate (TESSALON) capsule 200 mg (has no administration in time range)     Initial Impression / Assessment and Plan / ED Course  I have reviewed the triage vital signs and the nursing notes.  Pertinent labs & imaging results that were available during my care of the patient were reviewed by me and  considered in my medical decision making (see chart for details).     Patient was placed on Tessalon to help her with her cough.  Symptoms suggesting simple URI.  Her chest x-ray is clear, no peripheral edema or signs or symptoms suggesting CHF.  No wheezing.  Discussed home treatments in addition to Tessalon, rest, fluids.  Return precautions discussed.    Prior to discharge patient states her OTC "sinus"medication is not helping.  She was advised to stop taking that medication, she does not know the name of it but knows it was not Zyrtec.  This was prescribed for her in stead.  Final Clinical Impressions(s) / ED Diagnoses   Final diagnoses:  Viral URI with cough    ED Discharge Orders         Ordered    benzonatate (TESSALON) 100 MG capsule  3 times daily PRN,   Status:  Discontinued     05/29/18 1024  benzonatate (TESSALON) 100 MG capsule  3 times daily PRN     05/29/18 1047    cetirizine (ZYRTEC ALLERGY) 10 MG tablet  Daily     05/29/18 1047           Evalee Jefferson, PA-C 05/29/18 1030    Evalee Jefferson, PA-C 05/29/18 1048    Virgel Manifold, MD 05/29/18 [phone]

## 2018-05-29 NOTE — ED Triage Notes (Signed)
Pt states she has had a cough for the past 4 days with minimal yellow sputum and no fever.

## 2018-07-29 DIAGNOSIS — H43393 Other vitreous opacities, bilateral: Secondary | ICD-10-CM | POA: Diagnosis not present

## 2018-07-29 DIAGNOSIS — E119 Type 2 diabetes mellitus without complications: Secondary | ICD-10-CM | POA: Diagnosis not present

## 2018-07-29 DIAGNOSIS — H35033 Hypertensive retinopathy, bilateral: Secondary | ICD-10-CM | POA: Diagnosis not present

## 2018-07-29 DIAGNOSIS — H43811 Vitreous degeneration, right eye: Secondary | ICD-10-CM | POA: Diagnosis not present

## 2018-08-08 ENCOUNTER — Encounter (HOSPITAL_COMMUNITY): Payer: Self-pay | Admitting: Emergency Medicine

## 2018-08-08 ENCOUNTER — Emergency Department (HOSPITAL_COMMUNITY): Payer: Medicare Other

## 2018-08-08 ENCOUNTER — Observation Stay (HOSPITAL_COMMUNITY)
Admission: EM | Admit: 2018-08-08 | Discharge: 2018-08-09 | Disposition: A | Discharge status: Home / Self Care | Payer: Medicare Other | Attending: Family Medicine | Admitting: Family Medicine

## 2018-08-08 ENCOUNTER — Other Ambulatory Visit: Payer: Self-pay

## 2018-08-08 DIAGNOSIS — R072 Precordial pain: Secondary | ICD-10-CM | POA: Insufficient documentation

## 2018-08-08 DIAGNOSIS — J4541 Moderate persistent asthma with (acute) exacerbation: Secondary | ICD-10-CM | POA: Insufficient documentation

## 2018-08-08 DIAGNOSIS — R232 Flushing: Secondary | ICD-10-CM | POA: Insufficient documentation

## 2018-08-08 DIAGNOSIS — J45909 Unspecified asthma, uncomplicated: Secondary | ICD-10-CM | POA: Diagnosis present

## 2018-08-08 DIAGNOSIS — N951 Menopausal and female climacteric states: Secondary | ICD-10-CM | POA: Diagnosis present

## 2018-08-08 DIAGNOSIS — N179 Acute kidney failure, unspecified: Secondary | ICD-10-CM | POA: Diagnosis not present

## 2018-08-08 DIAGNOSIS — R05 Cough: Secondary | ICD-10-CM | POA: Diagnosis not present

## 2018-08-08 DIAGNOSIS — I10 Essential (primary) hypertension: Secondary | ICD-10-CM | POA: Diagnosis not present

## 2018-08-08 DIAGNOSIS — E11319 Type 2 diabetes mellitus with unspecified diabetic retinopathy without macular edema: Secondary | ICD-10-CM | POA: Diagnosis not present

## 2018-08-08 DIAGNOSIS — E1121 Type 2 diabetes mellitus with diabetic nephropathy: Secondary | ICD-10-CM | POA: Diagnosis present

## 2018-08-08 DIAGNOSIS — F17210 Nicotine dependence, cigarettes, uncomplicated: Secondary | ICD-10-CM | POA: Diagnosis not present

## 2018-08-08 DIAGNOSIS — R0602 Shortness of breath: Secondary | ICD-10-CM | POA: Diagnosis not present

## 2018-08-08 DIAGNOSIS — J101 Influenza due to other identified influenza virus with other respiratory manifestations: Principal | ICD-10-CM | POA: Insufficient documentation

## 2018-08-08 DIAGNOSIS — R000 Tachycardia, unspecified: Secondary | ICD-10-CM | POA: Diagnosis not present

## 2018-08-08 DIAGNOSIS — F17200 Nicotine dependence, unspecified, uncomplicated: Secondary | ICD-10-CM | POA: Diagnosis present

## 2018-08-08 HISTORY — DX: Type 2 diabetes mellitus with unspecified diabetic retinopathy without macular edema: E11.319

## 2018-08-08 LAB — CBC
HCT: 39.9 % (ref 36.0–46.0)
HEMOGLOBIN: 12.1 g/dL (ref 12.0–15.0)
MCH: 26.4 pg (ref 26.0–34.0)
MCHC: 30.3 g/dL (ref 30.0–36.0)
MCV: 86.9 fL (ref 80.0–100.0)
NRBC: 0 % (ref 0.0–0.2)
PLATELETS: 284 10*3/uL (ref 150–400)
RBC: 4.59 MIL/uL (ref 3.87–5.11)
RDW: 17.2 % — ABNORMAL HIGH (ref 11.5–15.5)
WBC: 10 10*3/uL (ref 4.0–10.5)

## 2018-08-08 LAB — BASIC METABOLIC PANEL
ANION GAP: 9 (ref 5–15)
BUN: 37 mg/dL — ABNORMAL HIGH (ref 8–23)
CO2: 25 mmol/L (ref 22–32)
Calcium: 9.9 mg/dL (ref 8.9–10.3)
Chloride: 103 mmol/L (ref 98–111)
Creatinine, Ser: 2.22 mg/dL — ABNORMAL HIGH (ref 0.44–1.00)
GFR calc non Af Amer: 22 mL/min — ABNORMAL LOW (ref >60)
GFR, EST AFRICAN AMERICAN: 26 mL/min — AB (ref >60)
Glucose, Bld: 132 mg/dL — ABNORMAL HIGH (ref 70–99)
Potassium: 3.9 mmol/L (ref 3.5–5.1)
Sodium: 137 mmol/L (ref 135–145)

## 2018-08-08 LAB — RAPID URINE DRUG SCREEN, HOSP PERFORMED
Amphetamines: NOT DETECTED
BENZODIAZEPINES: NOT DETECTED
Barbiturates: NOT DETECTED
Cocaine: NOT DETECTED
Opiates: NOT DETECTED
Tetrahydrocannabinol: NOT DETECTED

## 2018-08-08 LAB — CBG MONITORING, ED
GLUCOSE-CAPILLARY: 114 mg/dL — AB (ref 70–99)
Glucose-Capillary: 222 mg/dL — ABNORMAL HIGH (ref 70–99)

## 2018-08-08 LAB — INFLUENZA PANEL BY PCR (TYPE A & B)
Influenza A By PCR: POSITIVE — AB
Influenza B By PCR: NEGATIVE

## 2018-08-08 LAB — HEMOGLOBIN A1C
Hgb A1c MFr Bld: 6.6 % — ABNORMAL HIGH (ref 4.8–5.6)
Mean Plasma Glucose: 142.72 mg/dL

## 2018-08-08 LAB — TROPONIN I: Troponin I: <0.03 ng/mL (ref <0.03)

## 2018-08-08 MED ORDER — PANTOPRAZOLE SODIUM 40 MG PO TBEC
40.0000 mg | DELAYED_RELEASE_TABLET | Freq: Every day | ORAL | Status: DC
  Administered 2018-08-08 – 2018-08-09 (×2): 40 mg via ORAL
  Filled 2018-08-08 (×2): qty 1

## 2018-08-08 MED ORDER — ALBUTEROL SULFATE (2.5 MG/3ML) 0.083% IN NEBU: 2.5000 mg | INHALATION_SOLUTION | RESPIRATORY_TRACT | Status: DC | PRN

## 2018-08-08 MED ORDER — INSULIN ASPART 100 UNIT/ML ~~LOC~~ SOLN
0.0000 [IU] | Freq: Every day | SUBCUTANEOUS | Status: DC
  Administered 2018-08-08: 2 [IU] via SUBCUTANEOUS
  Filled 2018-08-08: qty 1

## 2018-08-08 MED ORDER — TRAMADOL HCL 50 MG PO TABS
50.0000 mg | ORAL_TABLET | Freq: Four times a day (QID) | ORAL | Status: DC | PRN
  Administered 2018-08-08 – 2018-08-09 (×4): 50 mg via ORAL
  Filled 2018-08-08 (×4): qty 1

## 2018-08-08 MED ORDER — INSULIN ASPART 100 UNIT/ML ~~LOC~~ SOLN
0.0000 [IU] | Freq: Three times a day (TID) | SUBCUTANEOUS | Status: DC
  Administered 2018-08-09: 3 [IU] via SUBCUTANEOUS

## 2018-08-08 MED ORDER — METHYLPREDNISOLONE SODIUM SUCC 125 MG IJ SOLR
125.0000 mg | Freq: Once | INTRAMUSCULAR | Status: AC
  Administered 2018-08-08: 125 mg via INTRAVENOUS
  Filled 2018-08-08: qty 2

## 2018-08-08 MED ORDER — ALBUTEROL SULFATE (2.5 MG/3ML) 0.083% IN NEBU
5.0000 mg | INHALATION_SOLUTION | Freq: Once | RESPIRATORY_TRACT | Status: AC
  Administered 2018-08-08: 5 mg via RESPIRATORY_TRACT
  Filled 2018-08-08: qty 6

## 2018-08-08 MED ORDER — OSELTAMIVIR PHOSPHATE 75 MG PO CAPS
75.0000 mg | ORAL_CAPSULE | Freq: Once | ORAL | Status: AC
  Administered 2018-08-08: 75 mg via ORAL
  Filled 2018-08-08: qty 1

## 2018-08-08 MED ORDER — DIPHENHYDRAMINE HCL 25 MG PO CAPS
25.0000 mg | ORAL_CAPSULE | Freq: Every evening | ORAL | Status: DC | PRN
  Administered 2018-08-08: 25 mg via ORAL
  Filled 2018-08-08: qty 1

## 2018-08-08 MED ORDER — ENOXAPARIN SODIUM 30 MG/0.3ML ~~LOC~~ SOLN
30.0000 mg | SUBCUTANEOUS | Status: DC
  Administered 2018-08-08: 30 mg via SUBCUTANEOUS
  Filled 2018-08-08: qty 0.3

## 2018-08-08 MED ORDER — OSELTAMIVIR PHOSPHATE 75 MG PO CAPS: 75.0000 mg | ORAL_CAPSULE | Freq: Two times a day (BID) | ORAL | Status: DC

## 2018-08-08 MED ORDER — SODIUM CHLORIDE 0.9 % IV BOLUS
500.0000 mL | Freq: Once | INTRAVENOUS | Status: AC
  Administered 2018-08-08: 500 mL via INTRAVENOUS

## 2018-08-08 MED ORDER — IPRATROPIUM-ALBUTEROL 0.5-2.5 (3) MG/3ML IN SOLN
3.0000 mL | Freq: Once | RESPIRATORY_TRACT | Status: AC
  Administered 2018-08-08: 3 mL via RESPIRATORY_TRACT
  Filled 2018-08-08: qty 3

## 2018-08-08 MED ORDER — ACETAMINOPHEN 325 MG PO TABS
650.0000 mg | ORAL_TABLET | Freq: Four times a day (QID) | ORAL | Status: DC | PRN
  Administered 2018-08-08: 650 mg via ORAL

## 2018-08-08 MED ORDER — PREDNISONE 20 MG PO TABS: 40.0000 mg | ORAL_TABLET | Freq: Every day | ORAL | Status: DC

## 2018-08-08 MED ORDER — IPRATROPIUM-ALBUTEROL 0.5-2.5 (3) MG/3ML IN SOLN
3.0000 mL | Freq: Four times a day (QID) | RESPIRATORY_TRACT | Status: DC
  Administered 2018-08-08 – 2018-08-09 (×4): 3 mL via RESPIRATORY_TRACT
  Filled 2018-08-08 (×4): qty 3

## 2018-08-08 MED ORDER — NICOTINE 14 MG/24HR TD PT24
MEDICATED_PATCH | TRANSDERMAL | Status: AC
  Filled 2018-08-08: qty 1

## 2018-08-08 MED ORDER — HYDRALAZINE HCL 20 MG/ML IJ SOLN: 5.0000 mg | INTRAMUSCULAR | Status: DC | PRN

## 2018-08-08 MED ORDER — OSELTAMIVIR PHOSPHATE 30 MG PO CAPS: 30.0000 mg | ORAL_CAPSULE | Freq: Every day | ORAL | Status: DC

## 2018-08-08 MED ORDER — LACTATED RINGERS IV SOLN
INTRAVENOUS | Status: DC
  Administered 2018-08-08 – 2018-08-09 (×2): via INTRAVENOUS

## 2018-08-08 MED ORDER — ENOXAPARIN SODIUM 40 MG/0.4ML ~~LOC~~ SOLN: 40.0000 mg | SUBCUTANEOUS | Status: DC

## 2018-08-08 MED ORDER — TRAZODONE HCL 50 MG PO TABS
50.0000 mg | ORAL_TABLET | Freq: Every day | ORAL | Status: DC
  Administered 2018-08-08: 50 mg via ORAL
  Filled 2018-08-08: qty 1

## 2018-08-08 MED ORDER — NICOTINE 14 MG/24HR TD PT24
14.0000 mg | MEDICATED_PATCH | Freq: Every day | TRANSDERMAL | Status: DC
  Administered 2018-08-08 – 2018-08-09 (×2): 14 mg via TRANSDERMAL
  Filled 2018-08-08: qty 1

## 2018-08-08 MED ORDER — GABAPENTIN 400 MG PO CAPS
400.0000 mg | ORAL_CAPSULE | Freq: Two times a day (BID) | ORAL | Status: DC
  Administered 2018-08-08 – 2018-08-09 (×2): 400 mg via ORAL
  Filled 2018-08-08 (×2): qty 1

## 2018-08-08 MED ORDER — METHYLPREDNISOLONE SODIUM SUCC 125 MG IJ SOLR
60.0000 mg | Freq: Two times a day (BID) | INTRAMUSCULAR | Status: DC
  Administered 2018-08-09: 60 mg via INTRAVENOUS
  Filled 2018-08-08: qty 2

## 2018-08-08 MED ORDER — ACETAMINOPHEN 325 MG PO TABS
ORAL_TABLET | ORAL | Status: AC
  Administered 2018-08-08: 650 mg via ORAL
  Filled 2018-08-08: qty 2

## 2018-08-08 MED ORDER — DIPHENHYDRAMINE-APAP (SLEEP) 25-500 MG PO TABS: 1.0000 | ORAL_TABLET | Freq: Every evening | ORAL | Status: DC | PRN

## 2018-08-08 MED ORDER — ONDANSETRON HCL 4 MG/2ML IJ SOLN
4.0000 mg | Freq: Four times a day (QID) | INTRAMUSCULAR | Status: DC | PRN
  Administered 2018-08-09: 4 mg via INTRAVENOUS
  Filled 2018-08-08: qty 2

## 2018-08-08 NOTE — H&P (Signed)
History and Physical    Barbara Wilcox:[id number] DOB: [birth year] DOA: 08/08/2018  PCP: Gregor Hams, MD; moving from [facility] and has an appointment Monday with her new doctor at [facility] Consultants:  None Patient coming from:  Home - lives in [place] but is moving to [place] with her sister; Donald Prose: Sister, [phone]  Chief Complaint: SOB  HPI: Barbara Wilcox is a 68 y.o. female with medical history significant of HTN; DM; and asthma presenting with SOB.  She just got sick last night.  She had her hair pretty and took it out and had a wet head and she got sick.  She has chronically ill brother and sister and she is stressed.  She has been coughing and SOB.  She has chest tightness.  No fever.  +rhinorrhea, throat feels scratchy from coughing.  +wheezing.  She was drinking cough syrup without relief.     ED Course:  Long-time smoker, known asthma and likely COPD.  Chest tightness, hypoxia, SOB.  Given nebs and steroids and feeling a bit better.  No desat on ambulation but ongoing SOB.  +influenza A.  Given Tamiflu and another neb.  Likely needs q2h nebs.  Review of Systems: As per HPI; otherwise review of systems reviewed and negative.   Ambulatory Status:  Ambulates without assistance  Past Medical History:  Diagnosis Date  . Acid reflux   . Arthritis   . Asthma   . Bronchitis   . Bursitis   . Diabetes mellitus with retinopathy ([place])   . Hypertension     Past Surgical History:  Procedure Laterality Date  . ABDOMINAL HYSTERECTOMY      Social History   Socioeconomic History  . Marital status: Single    Spouse name: Not on file  . Number of children: Not on file  . Years of education: Not on file  . Highest education level: Not on file  Occupational History  . Occupation: Disabled - arthritis  Social Needs  . Financial resource strain: Not on file  . Food insecurity:    Worry: Not on file    Inability: Not on file  . Transportation  needs:    Medical: Not on file    Non-medical: Not on file  Tobacco Use  . Smoking status: Current Every Day Smoker    Packs/day: 0.50    Years: 36.00    Pack years: 18.00    Types: Cigarettes  . Smokeless tobacco: Never Used  Substance and Sexual Activity  . Alcohol use: Yes    Comment: rare  . Drug use: Yes    Types: Marijuana    Comment: occasionally; last use a while ago  . Sexual activity: Yes    Birth control/protection: Surgical  Lifestyle  . Physical activity:    Days per week: Not on file    Minutes per session: Not on file  . Stress: Not on file  Relationships  . Social connections:    Talks on phone: Not on file    Gets together: Not on file    Attends religious service: Not on file    Active member of club or organization: Not on file    Attends meetings of clubs or organizations: Not on file    Relationship status: Not on file  . Intimate partner violence:    Fear of current or ex partner: Not on file    Emotionally abused: Not on file    Physically abused: Not on file  Forced sexual activity: Not on file  Other Topics Concern  . Not on file  Social History Narrative  . Not on file    Allergies  Allergen Reactions  . Aspirin Nausea And Vomiting and Other (See Comments)    Heart racing    Family History  Problem Relation Age of Onset  . Chronic Renal Failure Sister        on HD  . Bone cancer Brother     Prior to Admission medications   Medication Sig Start Date End Date Taking? Authorizing Provider  albuterol (PROVENTIL HFA;VENTOLIN HFA) 108 (90 BASE) MCG/ACT inhaler Inhale 2 puffs into the lungs every 6 (six) hours as needed. For shortness of breath.   Yes [provider]  diphenhydramine-acetaminophen (TYLENOL PM) 25-500 MG TABS tablet Take 1 tablet by mouth at bedtime as needed.   Yes [provider]  gabapentin (NEURONTIN) 400 MG capsule Take 400 mg by mouth 2 (two) times daily.    Yes [provider]    lisinopril-hydrochlorothiazide (PRINZIDE,ZESTORETIC) 10-12.5 MG tablet Take 1 tablet by mouth daily. 03/16/18  Yes [provider]  metFORMIN (GLUCOPHAGE) 500 MG tablet Take 250 mg by mouth daily with breakfast.   Yes [provider]  omeprazole (PRILOSEC) 20 MG capsule Take 1 capsule (20 mg total) by mouth daily. 12/20/14  Yes Noemi Chapel, MD  traZODone (DESYREL) 50 MG tablet Take 50 mg by mouth at bedtime.   Yes [provider]  azithromycin (ZITHROMAX) 250 MG tablet Take 2 po the first day then once a day for the next 4 days. Patient not taking: Reported on 08/08/2018 01/19/18   Rolland Porter, MD  benzonatate (TESSALON) 100 MG capsule Take 2 capsules (200 mg total) by mouth 3 (three) times daily as needed. Patient not taking: Reported on 08/08/2018 05/29/18   Evalee Jefferson, PA-C  cetirizine (ZYRTEC ALLERGY) 10 MG tablet Take 1 tablet (10 mg total) by mouth daily. Patient not taking: Reported on 08/08/2018 05/29/18   Evalee Jefferson, PA-C    Physical Exam: Vitals:   08/08/18 1438 08/08/18 1530 08/08/18 1551 08/08/18 1638  BP: 110/62 112/62  130/63  Pulse: (!) 113 (!) 110  (!) 115  Resp: 15 (!) 27  (!) 22  Temp:      TempSrc:      SpO2: 96% 100% 100% 94%  Weight:      Height:         . General:  Appears calm and comfortable and is NAD; minimally increased WOB despite extensive conversation . Eyes:  PERRL, EOMI, normal lids, iris . ENT:  grossly normal hearing, lips & tongue, mmm; appropriate dentition . Neck:  no LAD, masses or thyromegaly . Cardiovascular:  RR with mild tachycardia, no m/r/g. No LE edema.  Marland Kitchen Respiratory:   CTA bilaterally with scattered wheezes and moderate air movement.  Normal to mildly increased respiratory effort. . Abdomen:  soft, NT, ND, NABS . Back:   normal alignment, no CVAT . Skin:  no rash or induration seen on limited exam . Musculoskeletal:  grossly normal tone BUE/BLE, good ROM, no bony abnormality . Psychiatric:  grossly normal mood  and affect, speech fluent and appropriate, AOx3 . Neurologic:  CN 2-12 grossly intact, moves all extremities in coordinated fashion, sensation intact    Radiological Exams on Admission: Dg Chest 2 View  Result Date: 08/08/2018 CLINICAL DATA:  Cough, shortness of breath. EXAM: CHEST - 2 VIEW COMPARISON:  Radiographs of May 29, 2018. FINDINGS: The heart  size and mediastinal contours are within normal limits. Both lungs are clear. No pneumothorax or pleural effusion is noted. The visualized skeletal structures are unremarkable. IMPRESSION: No active cardiopulmonary disease. Electronically Signed   By: Marijo Conception, M.D.   On: 08/08/2018 14:12    EKG: Independently reviewed.  Sinus tachycardia with rate 115; nonspecific ST changes with no evidence of acute ischemia   Labs on Admission: I have personally reviewed the available labs and imaging studies at the time of the admission.  Pertinent labs:   Glucose 132 BUN 37/Creatinine 2.22/GFR 26; prior 26/1.38/46 in 6/16 Troponin <0.03 WBC 10.0, essentially normal CBC UA normal Influenza A positive  Assessment/Plan Principal Problem:   Influenza A Active Problems:   TOBACCO USER   Asthma   MENOPAUSE-RELATED VASOMOTOR SYMPTOMS, HOT FLASHES   Hypertension   Diabetes mellitus with retinopathy (HCC)   Influenza A -Supportive care -Tamiflu BID -IVF at 100 cc/hr -Observation overnight -Anticipate resolution of tachycardia and improvement in symptoms as flu improves  Asthma vs. COPD -She has history of reported asthma but also has longstanding and continued tobacco abuse. -Nebulizers: prn albuterol and Duonebs -Solu-Medrol with transition to prednisone after 24 hours -No antibiotics at this time   Acute renal failure -Uncertain baseline creatinine - was 1.38 when last checked in 2016 -Likely there is a component of acute and chronic CKD -Will hydrate and recheck BMP in AM -Hold ACE, HCTZ, and Glucophage for  now  HTN -Hold Zestoretic due to renal insufficiency -Will add prn IV hydralazine for now  DM -Will check A1c -hold Glucophage -Cover with moderate-scale SSI  Hot flashes    -Patient is having significant menopausal symptoms that are disruptive to her lifestyle -She may need to consider HRT -I have encouraged her to discuss this with her PCP  Tobacco dependence -Encourage cessation.   -This was discussed with the patient and should be reviewed on an ongoing basis.   -Patch ordered at patient request.  DVT prophylaxis: Lovenox  Code Status:  DNR - confirmed with patient Family Communication: None present Disposition Plan:  Home once clinically improved Consults called: None  Admission status: It is my clinical opinion that referral for OBSERVATION is reasonable and necessary in this patient based on the above information provided. The aforementioned taken together are felt to place the patient at high risk for further clinical deterioration. However it is anticipated that the patient may be medically stable for discharge from the hospital within 24 to 48 hours.    Karmen Bongo MD [facility]  If note is complete, please contact covering daytime or nighttime physician. [email or web address]   08/08/2018, 4:43 PM

## 2018-08-08 NOTE — ED Notes (Signed)
Pt ambulated well. O2 sats went down to 93 and then went back up to 99.

## 2018-08-08 NOTE — ED Provider Notes (Signed)
Emergency Department Provider Note   I have reviewed the triage vital signs and the nursing notes.   HISTORY  Chief Complaint Shortness of Breath   HPI Barbara Wilcox is a 68 y.o. female with PMH of DM, HTN, and asthma presents to the emergency department for evaluation of cough, wheezing, shortness of breath.  Symptoms began last night and have been progressively worsening.  Patient states she is almost out of her albuterol inhaler and has an appointment scheduled for later in the week her PCP.  She denies any fevers.  She has had some facial/nasal congestion.  No body aches, headache, sore throat.  She notes some chest tightness with breathing.  Her abdomen is mildly tender but she states this also feels mostly like breathing discomfort.  She does smoke tobacco but has been trying to quit.  Past Medical History:  Diagnosis Date  . Acid reflux   . Arthritis   . Asthma   . Bronchitis   . Bursitis   . Diabetes mellitus without complication ([place])   . Hypertension     Patient Active Problem List   Diagnosis Date Noted  . Epistaxis 12/12/2014  . TOBACCO USER 05/02/2007  . ASTHMA 03/21/2007  . GERD 03/21/2007  . MENOPAUSE-RELATED VASOMOTOR SYMPTOMS, HOT FLASHES 03/21/2007    Past Surgical History:  Procedure Laterality Date  . ABDOMINAL HYSTERECTOMY     Allergies Aspirin  History reviewed. No pertinent family history.  Social History Social History   Tobacco Use  . Smoking status: Current Every Day Smoker    Packs/day: 0.50    Types: Cigarettes  . Smokeless tobacco: Never Used  Substance Use Topics  . Alcohol use: No  . Drug use: Yes    Types: Marijuana    Comment: occasionally    Review of Systems  Constitutional: No fever/chills Eyes: No visual changes. ENT: No sore throat. Positive runny nose.  Cardiovascular: Positive chest tightness.  Respiratory: Positive shortness of breath and cough.  Gastrointestinal: No abdominal pain.  No nausea, no  vomiting.  No diarrhea.  No constipation. Genitourinary: Negative for dysuria. Musculoskeletal: Negative for back pain. Skin: Negative for rash. Neurological: Negative for headaches, focal weakness or numbness.  10-point ROS otherwise negative.  ____________________________________________   PHYSICAL EXAM:  VITAL SIGNS: ED Triage Vitals [08/08/18 1256]  Enc Vitals Group     BP 110/72     Pulse Rate (!) 122     Resp (!) 32     Temp 98.2 F (36.8 C)     Temp Source Oral     SpO2 92 %     Weight 175 lb (79.4 kg)     Height 5\' 3"  (1.6 m)     Pain Score 10   Constitutional: Alert and oriented. Well appearing and in no acute distress. Eyes: Conjunctivae are normal.  Head: Atraumatic. Nose: No congestion/rhinnorhea. Mouth/Throat: Mucous membranes are moist.  Oropharynx non-erythematous. Neck: No stridor.   Cardiovascular: Tachcyardia. Good peripheral circulation. Grossly normal heart sounds.   Respiratory: Increased respiratory effort.  No retractions. Lungs with diminished sounds bilaterally. End-expiratory wheezing noted.  Gastrointestinal: Soft and nontender. No distention.  Musculoskeletal: No lower extremity tenderness nor edema. No gross deformities of extremities. Neurologic:  Normal speech and language. No gross focal neurologic deficits are appreciated.  Skin:  Skin is warm, dry and intact. No rash noted.  ____________________________________________   LABS (all labs ordered are listed, but only abnormal results are displayed)  Labs Reviewed  BASIC METABOLIC PANEL -  Abnormal; Notable for the following components:      Result Value   Glucose, Bld 132 (*)    BUN 37 (*)    Creatinine, Ser 2.22 (*)    GFR calc non Af Amer 22 (*)    GFR calc Af Amer 26 (*)    All other components within normal limits  CBC - Abnormal; Notable for the following components:   RDW 17.2 (*)    All other components within normal limits  INFLUENZA PANEL BY PCR (TYPE A & B) - Abnormal;  Notable for the following components:   Influenza A By PCR POSITIVE (*)    All other components within normal limits  TROPONIN I   ____________________________________________  EKG   EKG Interpretation  Date/Time:  Monday August 08 2018 13:02:46 EST Ventricular Rate:  115 PR Interval:  126 QRS Duration: 78 QT Interval:  330 QTC Calculation: 456 R Axis:   -34 Text Interpretation:  Sinus tachycardia Left axis deviation Cannot rule out Anterior infarct , age undetermined Abnormal ECG No STEMI.  Confirmed by Nanda Quinton [phone]) on 08/08/2018 1:17:55 PM       ____________________________________________  RADIOLOGY  Dg Chest 2 View  Result Date: 08/08/2018 CLINICAL DATA:  Cough, shortness of breath. EXAM: CHEST - 2 VIEW COMPARISON:  Radiographs of May 29, 2018. FINDINGS: The heart size and mediastinal contours are within normal limits. Both lungs are clear. No pneumothorax or pleural effusion is noted. The visualized skeletal structures are unremarkable. IMPRESSION: No active cardiopulmonary disease. Electronically Signed   By: Marijo Conception, M.D.   On: 08/08/2018 14:12    ____________________________________________   PROCEDURES  Procedure(s) performed:   Procedures  CRITICAL CARE Performed by: Margette Fast Total critical care time: 35 minutes Critical care time was exclusive of separately billable procedures and treating other patients. Critical care was necessary to treat or prevent imminent or life-threatening deterioration. Critical care was time spent personally by me on the following activities: development of treatment plan with patient and/or surrogate as well as nursing, discussions with consultants, evaluation of patient's response to treatment, examination of patient, obtaining history from patient or surrogate, ordering and performing treatments and interventions, ordering and review of laboratory studies, ordering and review of radiographic studies,  pulse oximetry and re-evaluation of patient's condition.  Nanda Quinton, MD Emergency Medicine  ____________________________________________   INITIAL IMPRESSION / ASSESSMENT AND PLAN / ED COURSE  Pertinent labs & imaging results that were available during my care of the patient were reviewed by me and considered in my medical decision making (see chart for details).  Patient presents to the emergency department for evaluation of shortness of breath with cough and wheezing.  She has diminished air entry throughout with end expiratory wheezing.  She has frequent coughing.  Some URI symptoms noted.  Afebrile here.  Extremely low suspicion for acute coronary syndrome or PE.  Plan for duo nebs and reassess.   No hypoxemia with ambulation but does have some SOB and continued wheezing and chest tightness. Plan for additional nebs. CXR reviewed. Flu A positive. Starting Tamiflu. Patient given Solumedrol. Plan for admit.   Discussed patient's case with Hospitalist, Dr. Lorin Mercy to request admission. Patient and family (if present) updated with plan. Care transferred to Hospitalist service.  I reviewed all nursing notes, vitals, pertinent old records, EKGs, labs, imaging (as available).  ____________________________________________  FINAL CLINICAL IMPRESSION(S) / ED DIAGNOSES  Final diagnoses:  Moderate persistent asthma with exacerbation  Precordial chest pain  Influenza A     MEDICATIONS GIVEN DURING THIS VISIT:  Medications  albuterol (PROVENTIL) (2.5 MG/3ML) 0.083% nebulizer solution 5 mg (5 mg Nebulization Given 08/08/18 1335)  ipratropium-albuterol (DUONEB) 0.5-2.5 (3) MG/3ML nebulizer solution 3 mL (3 mLs Nebulization Given 08/08/18 1332)  sodium chloride 0.9 % bolus 500 mL (0 mLs Intravenous Stopped 08/08/18 1538)  methylPREDNISolone sodium succinate (SOLU-MEDROL) 125 mg/2 mL injection 125 mg (125 mg Intravenous Given 08/08/18 1434)  oseltamivir (TAMIFLU) capsule 75 mg (75 mg Oral Given  08/08/18 1542)  ipratropium-albuterol (DUONEB) 0.5-2.5 (3) MG/3ML nebulizer solution 3 mL (3 mLs Nebulization Given 08/08/18 1551)    Note:  This document was prepared using Dragon voice recognition software and may include unintentional dictation errors.  Nanda Quinton, MD Emergency Medicine    Tawnie Ehresman, [facility], MD 08/08/18 [id number]

## 2018-08-08 NOTE — ED Notes (Signed)
Respiratory called for neb treatment.

## 2018-08-08 NOTE — ED Triage Notes (Signed)
Patient reports wheezing and SOB that started last night.

## 2018-08-09 DIAGNOSIS — J101 Influenza due to other identified influenza virus with other respiratory manifestations: Secondary | ICD-10-CM | POA: Diagnosis not present

## 2018-08-09 LAB — CBC
HCT: 34.8 % — ABNORMAL LOW (ref 36.0–46.0)
Hemoglobin: 10.6 g/dL — ABNORMAL LOW (ref 12.0–15.0)
MCH: 26.5 pg (ref 26.0–34.0)
MCHC: 30.5 g/dL (ref 30.0–36.0)
MCV: 87 fL (ref 80.0–100.0)
Platelets: 261 10*3/uL (ref 150–400)
RBC: 4 MIL/uL (ref 3.87–5.11)
RDW: 17.2 % — ABNORMAL HIGH (ref 11.5–15.5)
WBC: 9.3 10*3/uL (ref 4.0–10.5)
nRBC: 0 % (ref 0.0–0.2)

## 2018-08-09 LAB — BASIC METABOLIC PANEL
Anion gap: 7 (ref 5–15)
BUN: 30 mg/dL — ABNORMAL HIGH (ref 8–23)
CO2: 24 mmol/L (ref 22–32)
Calcium: 9.3 mg/dL (ref 8.9–10.3)
Chloride: 107 mmol/L (ref 98–111)
Creatinine, Ser: 1.18 mg/dL — ABNORMAL HIGH (ref 0.44–1.00)
GFR calc Af Amer: 56 mL/min — ABNORMAL LOW (ref >60)
GFR calc non Af Amer: 48 mL/min — ABNORMAL LOW (ref >60)
Glucose, Bld: 169 mg/dL — ABNORMAL HIGH (ref 70–99)
Potassium: 4.9 mmol/L (ref 3.5–5.1)
Sodium: 138 mmol/L (ref 135–145)

## 2018-08-09 LAB — GLUCOSE, CAPILLARY
GLUCOSE-CAPILLARY: 116 mg/dL — AB (ref 70–99)
Glucose-Capillary: 152 mg/dL — ABNORMAL HIGH (ref 70–99)

## 2018-08-09 MED ORDER — GUAIFENESIN ER 600 MG PO TB12: 600.0000 mg | ORAL_TABLET | Freq: Two times a day (BID) | ORAL | 0 refills | Status: AC

## 2018-08-09 MED ORDER — ALBUTEROL SULFATE HFA 108 (90 BASE) MCG/ACT IN AERS: 2.0000 | INHALATION_SPRAY | Freq: Four times a day (QID) | RESPIRATORY_TRACT | 1 refills | Status: AC | PRN

## 2018-08-09 MED ORDER — BENZONATATE 100 MG PO CAPS
100.0000 mg | ORAL_CAPSULE | Freq: Three times a day (TID) | ORAL | Status: DC | PRN
  Administered 2018-08-09: 100 mg via ORAL
  Filled 2018-08-09: qty 1

## 2018-08-09 MED ORDER — OSELTAMIVIR PHOSPHATE 30 MG PO CAPS
30.0000 mg | ORAL_CAPSULE | Freq: Two times a day (BID) | ORAL | Status: DC
  Administered 2018-08-09: 30 mg via ORAL
  Filled 2018-08-09: qty 1

## 2018-08-09 MED ORDER — OSELTAMIVIR PHOSPHATE 75 MG PO CAPS: 75.0000 mg | ORAL_CAPSULE | Freq: Two times a day (BID) | ORAL | 0 refills | Status: AC

## 2018-08-09 MED ORDER — ACETAMINOPHEN 325 MG PO TABS: 650.0000 mg | ORAL_TABLET | Freq: Four times a day (QID) | ORAL | Status: DC | PRN

## 2018-08-09 MED ORDER — PREDNISONE 20 MG PO TABS: 20.0000 mg | ORAL_TABLET | Freq: Every day | ORAL | 0 refills | Status: DC

## 2018-08-09 MED ORDER — SODIUM CHLORIDE 0.9 % IV SOLN
INTRAVENOUS | Status: DC
  Administered 2018-08-09: 10:00:00 via INTRAVENOUS

## 2018-08-09 MED ORDER — ENOXAPARIN SODIUM 40 MG/0.4ML ~~LOC~~ SOLN: 40.0000 mg | SUBCUTANEOUS | Status: DC

## 2018-08-09 MED ORDER — OSELTAMIVIR PHOSPHATE 75 MG PO CAPS: 75.0000 mg | ORAL_CAPSULE | Freq: Two times a day (BID) | ORAL | Status: DC

## 2018-08-09 NOTE — Progress Notes (Addendum)
Pt does not want to be DNR. MD notified.

## 2018-08-09 NOTE — Progress Notes (Signed)
Patient, who is alert and oriented x 3, conveys her desire to change code status from DNR to Full Code.  Consequently, I have placed an order changing the patient's code status to full code.     Babs Bertin, DO Hospitalist

## 2018-08-09 NOTE — Plan of Care (Signed)
  Problem: Education: Goal: Knowledge of General Education information will improve Description Including pain rating scale, medication(s)/side effects and non-pharmacologic comfort measures 08/09/2018 1210 by Rance Muir, RN Outcome: Adequate for Discharge 08/09/2018 1036 by Rance Muir, RN Outcome: Progressing   Problem: Health Behavior/Discharge Planning: Goal: Ability to manage health-related needs will improve 08/09/2018 1210 by Rance Muir, RN Outcome: Adequate for Discharge 08/09/2018 1036 by Rance Muir, RN Outcome: Progressing   Problem: Clinical Measurements: Goal: Ability to maintain clinical measurements within normal limits will improve 08/09/2018 1210 by Rance Muir, RN Outcome: Adequate for Discharge 08/09/2018 1036 by Rance Muir, RN Outcome: Progressing Goal: Will remain free from infection 08/09/2018 1210 by Rance Muir, RN Outcome: Adequate for Discharge 08/09/2018 1036 by Rance Muir, RN Outcome: Progressing Goal: Diagnostic test results will improve 08/09/2018 1210 by Rance Muir, RN Outcome: Adequate for Discharge 08/09/2018 1036 by Rance Muir, RN Outcome: Progressing Goal: Respiratory complications will improve 08/09/2018 1210 by Rance Muir, RN Outcome: Adequate for Discharge 08/09/2018 1036 by Rance Muir, RN Outcome: Progressing Goal: Cardiovascular complication will be avoided 08/09/2018 1210 by Rance Muir, RN Outcome: Adequate for Discharge 08/09/2018 1036 by Rance Muir, RN Outcome: Progressing   Problem: Activity: Goal: Risk for activity intolerance will decrease 08/09/2018 1210 by Rance Muir, RN Outcome: Adequate for Discharge 08/09/2018 1036 by Rance Muir, RN Outcome: Progressing   Problem: Nutrition: Goal: Adequate nutrition will be maintained 08/09/2018 1210 by Rance Muir, RN Outcome: Adequate for Discharge 08/09/2018 1036 by Rance Muir, RN Outcome: Progressing   Problem: Coping: Goal: Level of anxiety will decrease 08/09/2018 1210 by Rance Muir, RN Outcome: Adequate for  Discharge 08/09/2018 1036 by Rance Muir, RN Outcome: Progressing   Problem: Elimination: Goal: Will not experience complications related to bowel motility 08/09/2018 1210 by Rance Muir, RN Outcome: Adequate for Discharge 08/09/2018 1036 by Rance Muir, RN Outcome: Progressing Goal: Will not experience complications related to urinary retention 08/09/2018 1210 by Rance Muir, RN Outcome: Adequate for Discharge 08/09/2018 1036 by Rance Muir, RN Outcome: Progressing   Problem: Pain Managment: Goal: General experience of comfort will improve 08/09/2018 1210 by Rance Muir, RN Outcome: Adequate for Discharge 08/09/2018 1036 by Rance Muir, RN Outcome: Progressing   Problem: Safety: Goal: Ability to remain free from injury will improve 08/09/2018 1210 by Rance Muir, RN Outcome: Adequate for Discharge 08/09/2018 1036 by Rance Muir, RN Outcome: Progressing   Problem: Skin Integrity: Goal: Risk for impaired skin integrity will decrease 08/09/2018 1210 by Rance Muir, RN Outcome: Adequate for Discharge 08/09/2018 1036 by Rance Muir, RN Outcome: Progressing

## 2018-08-09 NOTE — Discharge Instructions (Signed)
1) You have the flu/influenza-----take Tamiflu 1 tablet twice a day as prescribed for another 4 days, drink plenty fluids, Please  rest and avoid strenuous activities 2) you are on prednisone this can make your blood sugars go high so check your blood sugars frequently and drink plenty fluids 3) smoking cessation strongly advised 4) follow-up with your primary care physician as advised within a week for recheck

## 2018-08-09 NOTE — Discharge Summary (Signed)
Barbara Wilcox, is a 68 y.o. female  DOB 09-17-1950  MRN [id number].  Admission date:  08/08/2018  Admitting Physician  Karmen Bongo, MD  Discharge Date:  08/09/2018   Primary MD  Gregor Hams, MD  Recommendations for primary care physician for things to follow:   1) you have the flu/influenza-----take Tamiflu 1 tablet twice a day as prescribed for another 4 days, drink plenty fluids, Please  rest and avoid strenuous activities 2) you are on prednisone this can make your blood sugars go high so check your blood sugars frequently and drink plenty fluids 3) smoking cessation strongly advised 4) follow-up with your primary care physician as advised within a week for recheck   Admission Diagnosis  Precordial chest pain [R07.2] Influenza A [J10.1] Moderate persistent asthma with exacerbation [J45.41]   Discharge Diagnosis  Precordial chest pain [R07.2] Influenza A [J10.1] Moderate persistent asthma with exacerbation [J45.41]    Principal Problem:   Influenza A Active Problems:   TOBACCO USER   Asthma   MENOPAUSE-RELATED VASOMOTOR SYMPTOMS, HOT FLASHES   Hypertension   Diabetes mellitus with retinopathy (Barbara Wilcox)      Past Medical History:  Diagnosis Date  . Acid reflux   . Arthritis   . Asthma   . Bronchitis   . Bursitis   . Diabetes mellitus with retinopathy ([name])   . Hypertension     Past Surgical History:  Procedure Laterality Date  . ABDOMINAL HYSTERECTOMY         HPI  from the history and physical done on the day of admission:     Chief Complaint: SOB  HPI: Barbara Wilcox is a 68 y.o. female with medical history significant of HTN; DM; and asthma presenting with SOB.  She just got sick last night.  She had her hair pretty and took it out and had a wet head and she got sick.  She has chronically ill brother and sister and she is stressed.  She has been coughing and  SOB.  She has chest tightness.  No fever.  +rhinorrhea, throat feels scratchy from coughing.  +wheezing.  She was drinking cough syrup without relief.     ED Course:  Long-time smoker, known asthma and likely COPD.  Chest tightness, hypoxia, SOB.  Given nebs and steroids and feeling a bit better.  No desat on ambulation but ongoing SOB.  +influenza A.  Given Tamiflu and another neb.  Likely needs q2h nebs.     Hospital Course:     1) influenza A--- likely improving, chest x-ray negative, discharged home on Tamiflu, continue supportive and symptomatic treatment  2) asthma/COPD flareup--- secondary to influenza, improved with bronchodilators and steroids, discharged home on prednisone, smoking cessation strongly advised, continue bronchodilators, O2 sats 96 to 97% on room air post ambulation  3) acute kidney injury--- baseline creatinine usually around 1.2-1.3, admission creatinine was up to 2.2 with a GFR of 26, with hydration creatinine back down to 1.1, GFR is back up to 56--avoid nephrotoxic agent maintain  adequate hydration  4)DM2-A1c 6.6 reflecting excellent diabetic control, resume home medications maintain adequate hydration  Discharge Condition: stable  Follow UP=--- PCP as advised   Diet and Activity recommendation:  As advised  Discharge Instructions     Discharge Instructions    Call MD for:  difficulty breathing, headache or visual disturbances   Complete by:  As directed    Call MD for:  persistant dizziness or light-headedness   Complete by:  As directed    Call MD for:  persistant nausea and vomiting   Complete by:  As directed    Call MD for:  severe uncontrolled pain   Complete by:  As directed    Call MD for:  temperature >100.4   Complete by:  As directed    Diet - low sodium heart healthy   Complete by:  As directed    Discharge instructions   Complete by:  As directed    1) you have the flu/influenza-----take Tamiflu 1 tablet twice a day as prescribed  for another 4 days, drink plenty fluids, Please  rest and avoid strenuous activities 2) you are on prednisone this can make your blood sugars go high so check your blood sugars frequently and drink plenty fluids 3) smoking cessation strongly advised 4) follow-up with your primary care physician as advised within a week for recheck   Increase activity slowly   Complete by:  As directed         Discharge Medications     Allergies as of 08/09/2018      Reactions   Aspirin Nausea And Vomiting, Other (See Comments)   Heart racing      Medication List    TAKE these medications   albuterol 108 (90 Base) MCG/ACT inhaler Commonly known as:  PROVENTIL HFA;VENTOLIN HFA Inhale 2 puffs into the lungs every 6 (six) hours as needed. For shortness of breath.   diphenhydramine-acetaminophen 25-500 MG Tabs tablet Commonly known as:  TYLENOL PM Take 1 tablet by mouth at bedtime as needed.   gabapentin 400 MG capsule Commonly known as:  NEURONTIN Take 400 mg by mouth 2 (two) times daily.   guaiFENesin 600 MG 12 hr tablet Commonly known as:  MUCINEX Take 1 tablet (600 mg total) by mouth 2 (two) times daily for 10 days.   lisinopril-hydrochlorothiazide 10-12.5 MG tablet Commonly known as:  PRINZIDE,ZESTORETIC Take 1 tablet by mouth daily.   metFORMIN 500 MG tablet Commonly known as:  GLUCOPHAGE Take 250 mg by mouth daily with breakfast.   omeprazole 20 MG capsule Commonly known as:  PRILOSEC Take 1 capsule (20 mg total) by mouth daily.   oseltamivir 75 MG capsule Commonly known as:  TAMIFLU Take 1 capsule (75 mg total) by mouth 2 (two) times daily for 4 days.   predniSONE 20 MG tablet Commonly known as:  DELTASONE Take 1 tablet (20 mg total) by mouth daily with breakfast.   traZODone 50 MG tablet Commonly known as:  DESYREL Take 50 mg by mouth at bedtime.       Major procedures and Radiology Reports - PLEASE review detailed and final reports for all details, in brief -    Dg Chest 2 View  Result Date: 08/08/2018 CLINICAL DATA:  Cough, shortness of breath. EXAM: CHEST - 2 VIEW COMPARISON:  Radiographs of May 29, 2018. FINDINGS: The heart size and mediastinal contours are within normal limits. Both lungs are clear. No pneumothorax or pleural effusion is noted. The visualized skeletal structures are unremarkable.  IMPRESSION: No active cardiopulmonary disease. Electronically Signed   By: Marijo Conception, M.D.   On: 08/08/2018 14:12    Micro Results   No results found for this or any previous visit (from the past 240 hour(s)).     Today   Subjective    Barbara Wilcox today has no new concerns, voiding well, no productive cough, no fevers, eating and drinking well, ambulating around the hallways,  reminded to stay in the room because she has influenza         Eager to go home  No Vomiting no diarrhea,   Patient has been seen and examined prior to discharge   Objective   Blood pressure 108/71, pulse 99, temperature 100 F (37.8 C), temperature source Oral, resp. rate 20, height 5\' 3"  (1.6 m), weight 79.4 kg, SpO2 96 %.   Intake/Output Summary (Last 24 hours) at 08/09/2018 1036 Last data filed at 08/09/2018 0600 Gross per 24 hour  Intake 1736.43 ml  Output -  Net 1736.43 ml    Exam Gen:- Awake Alert, no acute distress  HEENT:- [place].AT, No sclera icterus Neck-Supple Neck,No JVD,.  Lungs-  CTAB , good air movement bilaterally  CV- S1, S2 normal, regular Abd-  +ve B.Sounds, Abd Soft, No tenderness,    Extremity/Skin:- No  edema,   good pulses Psych-affect is appropriate, oriented x3 Neuro-no new focal deficits, no tremors    Data Review   CBC w Diff:  Lab Results  Component Value Date   WBC 9.3 08/09/2018   HGB 10.6 (L) 08/09/2018   HCT 34.8 (L) 08/09/2018   PLT 261 08/09/2018   LYMPHOPCT 43 12/12/2014   MONOPCT 9 12/12/2014   EOSPCT 1 12/12/2014   BASOPCT 0 12/12/2014    CMP:  Lab Results  Component Value Date   NA 138  08/09/2018   K 4.9 08/09/2018   CL 107 08/09/2018   CO2 24 08/09/2018   BUN 30 (H) 08/09/2018   CREATININE 1.18 (H) 08/09/2018   PROT 7.8 12/20/2014   ALBUMIN 4.1 12/20/2014   BILITOT 0.5 12/20/2014   ALKPHOS 65 12/20/2014   AST 18 12/20/2014   ALT 21 12/20/2014  .   Total Discharge time is about 33 minutes  Roxan Hockey M.D on 08/09/2018 at 10:36 AM  Go to [email or web address] -  for contact info  [facility] - Office  [phone]

## 2018-08-09 NOTE — Plan of Care (Signed)

## 2018-08-18 DIAGNOSIS — E785 Hyperlipidemia, unspecified: Secondary | ICD-10-CM | POA: Diagnosis not present

## 2018-08-18 DIAGNOSIS — I10 Essential (primary) hypertension: Secondary | ICD-10-CM | POA: Diagnosis not present

## 2018-08-18 DIAGNOSIS — K219 Gastro-esophageal reflux disease without esophagitis: Secondary | ICD-10-CM | POA: Diagnosis not present

## 2018-08-18 DIAGNOSIS — E1142 Type 2 diabetes mellitus with diabetic polyneuropathy: Secondary | ICD-10-CM | POA: Diagnosis not present

## 2018-08-18 DIAGNOSIS — M170 Bilateral primary osteoarthritis of knee: Secondary | ICD-10-CM | POA: Diagnosis not present

## 2018-08-31 DIAGNOSIS — M79605 Pain in left leg: Secondary | ICD-10-CM | POA: Diagnosis not present

## 2018-08-31 DIAGNOSIS — Z79899 Other long term (current) drug therapy: Secondary | ICD-10-CM | POA: Diagnosis not present

## 2018-08-31 DIAGNOSIS — M25561 Pain in right knee: Secondary | ICD-10-CM | POA: Diagnosis not present

## 2018-08-31 DIAGNOSIS — G894 Chronic pain syndrome: Secondary | ICD-10-CM | POA: Diagnosis not present

## 2018-08-31 DIAGNOSIS — M25562 Pain in left knee: Secondary | ICD-10-CM | POA: Diagnosis not present

## 2018-08-31 DIAGNOSIS — J4521 Mild intermittent asthma with (acute) exacerbation: Secondary | ICD-10-CM | POA: Diagnosis not present

## 2018-08-31 DIAGNOSIS — M79604 Pain in right leg: Secondary | ICD-10-CM | POA: Diagnosis not present

## 2018-09-28 DIAGNOSIS — M79605 Pain in left leg: Secondary | ICD-10-CM | POA: Diagnosis not present

## 2018-09-28 DIAGNOSIS — M79604 Pain in right leg: Secondary | ICD-10-CM | POA: Diagnosis not present

## 2018-09-28 DIAGNOSIS — M25561 Pain in right knee: Secondary | ICD-10-CM | POA: Diagnosis not present

## 2018-09-28 DIAGNOSIS — M25562 Pain in left knee: Secondary | ICD-10-CM | POA: Diagnosis not present

## 2018-09-28 DIAGNOSIS — G894 Chronic pain syndrome: Secondary | ICD-10-CM | POA: Diagnosis not present

## 2018-09-28 DIAGNOSIS — I10 Essential (primary) hypertension: Secondary | ICD-10-CM | POA: Diagnosis not present

## 2018-11-30 DIAGNOSIS — I10 Essential (primary) hypertension: Secondary | ICD-10-CM | POA: Diagnosis not present

## 2018-11-30 DIAGNOSIS — G894 Chronic pain syndrome: Secondary | ICD-10-CM | POA: Diagnosis not present

## 2018-11-30 DIAGNOSIS — M25561 Pain in right knee: Secondary | ICD-10-CM | POA: Diagnosis not present

## 2018-11-30 DIAGNOSIS — Z79891 Long term (current) use of opiate analgesic: Secondary | ICD-10-CM | POA: Diagnosis not present

## 2018-11-30 DIAGNOSIS — M25562 Pain in left knee: Secondary | ICD-10-CM | POA: Diagnosis not present

## 2018-12-22 ENCOUNTER — Other Ambulatory Visit: Payer: Self-pay

## 2018-12-22 NOTE — Patient Outreach (Signed)
[facility]) Care Management  12/22/2018  Meli Faley [place] 07/18/1950 [id number]   Medication Adherence call to Mrs. [place] Compliant Voice message left with a call back number. Mrs. Cubero is showing due on Pravastatin 10 mg under [place].   [place] Management Direct Dial [phone]  Fax [phone] [email or web address]

## 2018-12-23 DIAGNOSIS — I10 Essential (primary) hypertension: Secondary | ICD-10-CM | POA: Diagnosis not present

## 2018-12-23 DIAGNOSIS — E1142 Type 2 diabetes mellitus with diabetic polyneuropathy: Secondary | ICD-10-CM | POA: Diagnosis not present

## 2018-12-23 DIAGNOSIS — Z0000 Encounter for general adult medical examination without abnormal findings: Secondary | ICD-10-CM | POA: Diagnosis not present

## 2018-12-23 DIAGNOSIS — E785 Hyperlipidemia, unspecified: Secondary | ICD-10-CM | POA: Diagnosis not present

## 2018-12-23 DIAGNOSIS — Z79899 Other long term (current) drug therapy: Secondary | ICD-10-CM | POA: Diagnosis not present

## 2018-12-24 ENCOUNTER — Inpatient Hospital Stay (HOSPITAL_COMMUNITY)
Admission: EM | Admit: 2018-12-24 | Discharge: 2018-12-27 | DRG: 872 | Disposition: A | Discharge status: Home / Self Care | Payer: Medicare Other | Attending: Internal Medicine | Admitting: Internal Medicine

## 2018-12-24 ENCOUNTER — Emergency Department (HOSPITAL_COMMUNITY): Payer: Medicare Other

## 2018-12-24 ENCOUNTER — Other Ambulatory Visit: Payer: Self-pay

## 2018-12-24 ENCOUNTER — Encounter (HOSPITAL_COMMUNITY): Payer: Self-pay | Admitting: Emergency Medicine

## 2018-12-24 DIAGNOSIS — R6520 Severe sepsis without septic shock: Secondary | ICD-10-CM | POA: Diagnosis not present

## 2018-12-24 DIAGNOSIS — I10 Essential (primary) hypertension: Secondary | ICD-10-CM | POA: Diagnosis present

## 2018-12-24 DIAGNOSIS — N179 Acute kidney failure, unspecified: Secondary | ICD-10-CM | POA: Diagnosis present

## 2018-12-24 DIAGNOSIS — R5381 Other malaise: Secondary | ICD-10-CM | POA: Diagnosis present

## 2018-12-24 DIAGNOSIS — J42 Unspecified chronic bronchitis: Secondary | ICD-10-CM | POA: Diagnosis not present

## 2018-12-24 DIAGNOSIS — Z79899 Other long term (current) drug therapy: Secondary | ICD-10-CM

## 2018-12-24 DIAGNOSIS — F17210 Nicotine dependence, cigarettes, uncomplicated: Secondary | ICD-10-CM | POA: Diagnosis present

## 2018-12-24 DIAGNOSIS — A419 Sepsis, unspecified organism: Secondary | ICD-10-CM

## 2018-12-24 DIAGNOSIS — E1121 Type 2 diabetes mellitus with diabetic nephropathy: Secondary | ICD-10-CM

## 2018-12-24 DIAGNOSIS — Z808 Family history of malignant neoplasm of other organs or systems: Secondary | ICD-10-CM

## 2018-12-24 DIAGNOSIS — I129 Hypertensive chronic kidney disease with stage 1 through stage 4 chronic kidney disease, or unspecified chronic kidney disease: Secondary | ICD-10-CM | POA: Diagnosis not present

## 2018-12-24 DIAGNOSIS — Z20828 Contact with and (suspected) exposure to other viral communicable diseases: Secondary | ICD-10-CM | POA: Diagnosis present

## 2018-12-24 DIAGNOSIS — E1140 Type 2 diabetes mellitus with diabetic neuropathy, unspecified: Secondary | ICD-10-CM | POA: Diagnosis not present

## 2018-12-24 DIAGNOSIS — M1990 Unspecified osteoarthritis, unspecified site: Secondary | ICD-10-CM | POA: Diagnosis not present

## 2018-12-24 DIAGNOSIS — K219 Gastro-esophageal reflux disease without esophagitis: Secondary | ICD-10-CM | POA: Diagnosis not present

## 2018-12-24 DIAGNOSIS — N390 Urinary tract infection, site not specified: Secondary | ICD-10-CM | POA: Diagnosis not present

## 2018-12-24 DIAGNOSIS — E1122 Type 2 diabetes mellitus with diabetic chronic kidney disease: Secondary | ICD-10-CM | POA: Diagnosis present

## 2018-12-24 DIAGNOSIS — F17200 Nicotine dependence, unspecified, uncomplicated: Secondary | ICD-10-CM | POA: Diagnosis not present

## 2018-12-24 DIAGNOSIS — J45909 Unspecified asthma, uncomplicated: Secondary | ICD-10-CM | POA: Diagnosis present

## 2018-12-24 DIAGNOSIS — A4151 Sepsis due to Escherichia coli [E. coli]: Secondary | ICD-10-CM | POA: Diagnosis not present

## 2018-12-24 DIAGNOSIS — J4520 Mild intermittent asthma, uncomplicated: Secondary | ICD-10-CM | POA: Diagnosis not present

## 2018-12-24 DIAGNOSIS — Z886 Allergy status to analgesic agent status: Secondary | ICD-10-CM

## 2018-12-24 DIAGNOSIS — Z7984 Long term (current) use of oral hypoglycemic drugs: Secondary | ICD-10-CM | POA: Diagnosis not present

## 2018-12-24 DIAGNOSIS — E860 Dehydration: Secondary | ICD-10-CM | POA: Diagnosis not present

## 2018-12-24 DIAGNOSIS — R109 Unspecified abdominal pain: Secondary | ICD-10-CM | POA: Diagnosis not present

## 2018-12-24 DIAGNOSIS — R05 Cough: Secondary | ICD-10-CM | POA: Diagnosis not present

## 2018-12-24 DIAGNOSIS — R7881 Bacteremia: Secondary | ICD-10-CM | POA: Diagnosis not present

## 2018-12-24 DIAGNOSIS — R0602 Shortness of breath: Secondary | ICD-10-CM | POA: Diagnosis not present

## 2018-12-24 DIAGNOSIS — R000 Tachycardia, unspecified: Secondary | ICD-10-CM | POA: Diagnosis not present

## 2018-12-24 DIAGNOSIS — E11319 Type 2 diabetes mellitus with unspecified diabetic retinopathy without macular edema: Secondary | ICD-10-CM | POA: Diagnosis present

## 2018-12-24 DIAGNOSIS — N183 Chronic kidney disease, stage 3 unspecified: Secondary | ICD-10-CM | POA: Diagnosis present

## 2018-12-24 DIAGNOSIS — Z841 Family history of disorders of kidney and ureter: Secondary | ICD-10-CM | POA: Diagnosis not present

## 2018-12-24 DIAGNOSIS — Z1611 Resistance to penicillins: Secondary | ICD-10-CM | POA: Diagnosis present

## 2018-12-24 LAB — COMPREHENSIVE METABOLIC PANEL
ALT: 25 U/L (ref 0–44)
AST: 15 U/L (ref 15–41)
Albumin: 3.4 g/dL — ABNORMAL LOW (ref 3.5–5.0)
Alkaline Phosphatase: 65 U/L (ref 38–126)
Anion gap: 13 (ref 5–15)
BUN: 36 mg/dL — ABNORMAL HIGH (ref 8–23)
CO2: 25 mmol/L (ref 22–32)
Calcium: 9.2 mg/dL (ref 8.9–10.3)
Chloride: 101 mmol/L (ref 98–111)
Creatinine, Ser: 1.73 mg/dL — ABNORMAL HIGH (ref 0.44–1.00)
GFR calc Af Amer: 35 mL/min — ABNORMAL LOW (ref >60)
GFR calc non Af Amer: 30 mL/min — ABNORMAL LOW (ref >60)
Glucose, Bld: 127 mg/dL — ABNORMAL HIGH (ref 70–99)
Potassium: 4.8 mmol/L (ref 3.5–5.1)
Sodium: 139 mmol/L (ref 135–145)
Total Bilirubin: 0.8 mg/dL (ref 0.3–1.2)
Total Protein: 7.8 g/dL (ref 6.5–8.1)

## 2018-12-24 LAB — CBC WITH DIFFERENTIAL/PLATELET
Abs Immature Granulocytes: 0.13 10*3/uL — ABNORMAL HIGH (ref 0.00–0.07)
Basophils Absolute: 0.1 10*3/uL (ref 0.0–0.1)
Basophils Relative: 0 %
Eosinophils Absolute: 0 10*3/uL (ref 0.0–0.5)
Eosinophils Relative: 0 %
HCT: 34.2 % — ABNORMAL LOW (ref 36.0–46.0)
Hemoglobin: 10.9 g/dL — ABNORMAL LOW (ref 12.0–15.0)
Immature Granulocytes: 1 %
Lymphocytes Relative: 18 %
Lymphs Abs: 3.4 10*3/uL (ref 0.7–4.0)
MCH: 27 pg (ref 26.0–34.0)
MCHC: 31.9 g/dL (ref 30.0–36.0)
MCV: 84.9 fL (ref 80.0–100.0)
Monocytes Absolute: 2.2 10*3/uL — ABNORMAL HIGH (ref 0.1–1.0)
Monocytes Relative: 11 %
Neutro Abs: 13.7 10*3/uL — ABNORMAL HIGH (ref 1.7–7.7)
Neutrophils Relative %: 70 %
Platelets: 303 10*3/uL (ref 150–400)
RBC: 4.03 MIL/uL (ref 3.87–5.11)
RDW: 15.2 % (ref 11.5–15.5)
WBC: 19.6 10*3/uL — ABNORMAL HIGH (ref 4.0–10.5)
nRBC: 0 % (ref 0.0–0.2)

## 2018-12-24 LAB — URINALYSIS, ROUTINE W REFLEX MICROSCOPIC
Bilirubin Urine: NEGATIVE
Glucose, UA: NEGATIVE mg/dL
Ketones, ur: NEGATIVE mg/dL
Nitrite: NEGATIVE
Protein, ur: 30 mg/dL — AB
Specific Gravity, Urine: 1.012 (ref 1.005–1.030)
WBC, UA: >50 WBC/hpf — ABNORMAL HIGH (ref 0–5)
pH: 7 (ref 5.0–8.0)

## 2018-12-24 LAB — SARS CORONAVIRUS 2 BY RT PCR (HOSPITAL ORDER, PERFORMED IN ~~LOC~~ HOSPITAL LAB): SARS Coronavirus 2: NEGATIVE

## 2018-12-24 LAB — GLUCOSE, CAPILLARY
Glucose-Capillary: 124 mg/dL — ABNORMAL HIGH (ref 70–99)
Glucose-Capillary: 116 mg/dL — ABNORMAL HIGH (ref 70–99)

## 2018-12-24 LAB — LACTIC ACID, PLASMA: Lactic Acid, Venous: 0.7 mmol/L (ref 0.5–1.9)

## 2018-12-24 MED ORDER — HYDRALAZINE HCL 20 MG/ML IJ SOLN
10.0000 mg | Freq: Three times a day (TID) | INTRAMUSCULAR | Status: DC | PRN
  Filled 2018-12-24: qty 1

## 2018-12-24 MED ORDER — ONDANSETRON HCL 4 MG/2ML IJ SOLN: 4.0000 mg | Freq: Four times a day (QID) | INTRAMUSCULAR | Status: DC | PRN

## 2018-12-24 MED ORDER — ONDANSETRON HCL 4 MG PO TABS: 4.0000 mg | ORAL_TABLET | Freq: Four times a day (QID) | ORAL | Status: DC | PRN

## 2018-12-24 MED ORDER — HYDROGEN PEROXIDE 3 % EX SOLN
CUTANEOUS | Status: AC
  Filled 2018-12-24: qty 473

## 2018-12-24 MED ORDER — TRAZODONE HCL 50 MG PO TABS
50.0000 mg | ORAL_TABLET | Freq: Every day | ORAL | Status: DC
  Administered 2018-12-24 – 2018-12-26 (×3): 50 mg via ORAL
  Filled 2018-12-24 (×3): qty 1

## 2018-12-24 MED ORDER — PANTOPRAZOLE SODIUM 40 MG PO TBEC
40.0000 mg | DELAYED_RELEASE_TABLET | Freq: Every day | ORAL | Status: DC
  Administered 2018-12-24 – 2018-12-27 (×4): 40 mg via ORAL
  Filled 2018-12-24 (×4): qty 1

## 2018-12-24 MED ORDER — ACETAMINOPHEN 325 MG PO TABS
650.0000 mg | ORAL_TABLET | Freq: Four times a day (QID) | ORAL | Status: DC | PRN
  Administered 2018-12-24 – 2018-12-26 (×6): 650 mg via ORAL
  Filled 2018-12-24 (×7): qty 2

## 2018-12-24 MED ORDER — SODIUM CHLORIDE 0.9 % IV SOLN
1.0000 g | INTRAVENOUS | Status: DC
  Administered 2018-12-24: 1 g via INTRAVENOUS
  Filled 2018-12-24 (×2): qty 10

## 2018-12-24 MED ORDER — ONDANSETRON HCL 4 MG/2ML IJ SOLN
4.0000 mg | Freq: Once | INTRAMUSCULAR | Status: AC
  Administered 2018-12-24: 11:00:00 4 mg via INTRAVENOUS
  Filled 2018-12-24: qty 2

## 2018-12-24 MED ORDER — NICOTINE 21 MG/24HR TD PT24
21.0000 mg | MEDICATED_PATCH | Freq: Every day | TRANSDERMAL | Status: DC
  Administered 2018-12-24 – 2018-12-27 (×4): 21 mg via TRANSDERMAL
  Filled 2018-12-24 (×5): qty 1

## 2018-12-24 MED ORDER — IOHEXOL 300 MG/ML  SOLN
80.0000 mL | Freq: Once | INTRAMUSCULAR | Status: AC | PRN
  Administered 2018-12-24: 100 mL via INTRAVENOUS

## 2018-12-24 MED ORDER — GABAPENTIN 400 MG PO CAPS
400.0000 mg | ORAL_CAPSULE | Freq: Two times a day (BID) | ORAL | Status: DC
  Administered 2018-12-24 – 2018-12-27 (×6): 400 mg via ORAL
  Filled 2018-12-24 (×6): qty 1

## 2018-12-24 MED ORDER — LACTATED RINGERS IV BOLUS
1000.0000 mL | Freq: Once | INTRAVENOUS | Status: AC
  Administered 2018-12-24: 11:00:00 1000 mL via INTRAVENOUS

## 2018-12-24 MED ORDER — TRAMADOL HCL 50 MG PO TABS
50.0000 mg | ORAL_TABLET | Freq: Four times a day (QID) | ORAL | Status: DC | PRN
  Administered 2018-12-24 – 2018-12-27 (×8): 50 mg via ORAL
  Filled 2018-12-24 (×8): qty 1

## 2018-12-24 MED ORDER — HEPARIN SODIUM (PORCINE) 5000 UNIT/ML IJ SOLN
5000.0000 [IU] | Freq: Three times a day (TID) | INTRAMUSCULAR | Status: DC
  Administered 2018-12-24 – 2018-12-27 (×9): 5000 [IU] via SUBCUTANEOUS
  Filled 2018-12-24 (×9): qty 1

## 2018-12-24 MED ORDER — INSULIN ASPART 100 UNIT/ML ~~LOC~~ SOLN
0.0000 [IU] | Freq: Three times a day (TID) | SUBCUTANEOUS | Status: DC
  Administered 2018-12-26: 1 [IU] via SUBCUTANEOUS

## 2018-12-24 MED ORDER — IPRATROPIUM-ALBUTEROL 0.5-2.5 (3) MG/3ML IN SOLN
3.0000 mL | Freq: Four times a day (QID) | RESPIRATORY_TRACT | Status: DC | PRN
  Administered 2018-12-25 – 2018-12-26 (×2): 3 mL via RESPIRATORY_TRACT
  Filled 2018-12-24 (×3): qty 3

## 2018-12-24 MED ORDER — INSULIN DETEMIR 100 UNIT/ML ~~LOC~~ SOLN
5.0000 [IU] | Freq: Every day | SUBCUTANEOUS | Status: DC
  Administered 2018-12-24 – 2018-12-26 (×3): 5 [IU] via SUBCUTANEOUS
  Filled 2018-12-24 (×4): qty 0.05

## 2018-12-24 MED ORDER — SODIUM CHLORIDE 0.9 % IV SOLN
1000.0000 mL | INTRAVENOUS | Status: DC
  Administered 2018-12-24 – 2018-12-25 (×4): 1000 mL via INTRAVENOUS

## 2018-12-24 MED ORDER — ACETAMINOPHEN 500 MG PO TABS
1000.0000 mg | ORAL_TABLET | Freq: Once | ORAL | Status: AC
  Administered 2018-12-24: 1000 mg via ORAL
  Filled 2018-12-24: qty 2

## 2018-12-24 NOTE — Progress Notes (Signed)
Positive blood cultures called to midlevel.

## 2018-12-24 NOTE — H&P (Signed)
History and Physical    Barbara Wilcox XBM:[id number] DOB: 1951-07-09 DOA: 12/24/2018  Referring MD/NP/PA: Dr. Sabra Heck. PCP: [facility]  Patient coming from: Home  Chief Complaint: Chills, general malaise and abd pain.  HPI: Barbara Wilcox is a 68 y.o. female with past medical history significant for asthma/chronic bronchitis, gastroesophageal reflux disease, hypertension, type 2 diabetes mellitus, tobacco abuse and chronic renal failure stage III; who presented to the hospital secondary to fever, general malaise and abdominal discomfort.  Patient reports symptoms have been present for the last 3 days or so and worsening.  Abdominal pain is localized in the left flank, depressible 7/10 in intensity, no aggravating or eating factors reported, patient reported associated nausea but no vomiting and also expressed some blood in her urine for approximately 48 hours prior to admission (that is now resolved according to patient report).  Patient also reports SOB and intermittent non-productive cough; but given her hx of asthma and bronchitis, these symptoms are not uncommon on her.  Patient denies any chest pain, headaches, blurred vision, focal weakness, melena, hematochezia, or any other complaints.  In the ED patient was found to be febrile, with elevated WBCs in the 19,000 range, tachycardic and with urinalysis suggesting UTI.  Chest x-ray was negative for any acute infiltrates. CT abd and pelvis demonstrated left perinephric stranding and associated nephrogram without hydronephrosis consistent with pyelonephritis.  Patient blood work also demonstrated acute on chronic renal failure and normal lactic acid.  Since patient met sepsis criteria on presentation patient was initiated on fluid resuscitation, started on IV antibiotics after cultures were taken and [facility] was called to admit patient for further evaluation and management.    Patient was COVID-test negative.  Past  Medical/Surgical History: Past Medical History:  Diagnosis Date  . Acid reflux   . Arthritis   . Asthma   . Bronchitis   . Bursitis   . Diabetes mellitus with retinopathy ([place])   . Hypertension     Past Surgical History:  Procedure Laterality Date  . ABDOMINAL HYSTERECTOMY      Social History:  reports that she has been smoking cigarettes. She has a 18.00 pack-year smoking history. She has never used smokeless tobacco. She reports current alcohol use. She reports current drug use. Drug: Marijuana.  Allergies: Allergies  Allergen Reactions  . Aspirin Nausea And Vomiting and Other (See Comments)    Heart racing    Family History:  Family History  Problem Relation Age of Onset  . Chronic Renal Failure Sister        on HD  . Bone cancer Brother     Prior to Admission medications   Medication Sig Start Date End Date Taking? Authorizing Provider  albuterol (PROVENTIL HFA;VENTOLIN HFA) 108 (90 Base) MCG/ACT inhaler Inhale 2 puffs into the lungs every 6 (six) hours as needed. For shortness of breath. 08/09/18  Yes Emokpae, Courage, MD  diphenhydramine-acetaminophen (TYLENOL PM) 25-500 MG TABS tablet Take 1 tablet by mouth at bedtime as needed.   Yes [provider]  gabapentin (NEURONTIN) 400 MG capsule Take 400 mg by mouth 2 (two) times daily.    Yes [provider]  lisinopril-hydrochlorothiazide (PRINZIDE,ZESTORETIC) 10-12.5 MG tablet Take 1 tablet by mouth daily. 03/16/18  Yes [provider]  metFORMIN (GLUCOPHAGE) 500 MG tablet Take 250 mg by mouth daily with breakfast.   Yes [provider]  omeprazole (PRILOSEC) 20 MG capsule Take 1 capsule (20 mg total) by mouth daily. 12/20/14  Yes  Noemi Chapel, MD  traMADol (ULTRAM) 50 MG tablet Take 50 mg by mouth every 6 (six) hours as needed.   Yes [provider]  traZODone (DESYREL) 50 MG tablet Take 50 mg by mouth at bedtime.   Yes [provider]    Review of Systems:   Negative except as otherwise mentioned in HPI.    Physical Exam: Vitals:   12/24/18 1130 12/24/18 1242 12/24/18 1456 12/24/18 1555  BP: 113/81 129/89 (!) 114/50   Pulse: 97 (!) 101 (!) 107   Resp: '20 18 18   ' Temp:  99.3 F (37.4 C) (!) 102.8 F (39.3 C) (!) 103.2 F (39.6 C)  TempSrc:   Oral Oral  SpO2: 100% 96% 95%   Weight:      Height:        Constitutional: Warm to touch, denying any chest pain and just expressing no feeling well. Eyes: PERRL, lids and conjunctivae normal, no icterus, no nystagmus. ENMT: Mucous membranes are dry on examination. Posterior pharynx clear of any exudate or lesions.  Neck: normal, supple, no masses, no thyromegaly, no JVD Respiratory: Good air movement bilaterally, positive rhonchi, mild expiratory wheezing appreciated on exam.  No use of accessory muscles, normal respiratory effort.  Cardiovascular: Tachycardic, no rubs, no gallops, no murmurs. Abdomen: positive BS, tenderness to palpation on left flank, no masses palpated. No hepatosplenomegaly. Bowel sounds positive.  Musculoskeletal: no clubbing / cyanosis. No joint deformity upper and lower extremities. Good ROM, no contractures. Normal muscle tone.  Skin: no rashes, lesions, ulcers. No induration Neurologic: CN 2-12 grossly intact. Sensation intact, DTR normal. Strength 5/5 in all 4.  Psychiatric: Normal judgment and insight. Alert and oriented x 3. Normal mood.    Labs on Admission: I have personally reviewed the following labs and imaging studies  CBC: Recent Labs  Lab 12/24/18 0820  WBC 19.6*  NEUTROABS 13.7*  HGB 10.9*  HCT 34.2*  MCV 84.9  PLT 270   Basic Metabolic Panel: Recent Labs  Lab 12/24/18 0820  NA 139  K 4.8  CL 101  CO2 25  GLUCOSE 127*  BUN 36*  CREATININE 1.73*  CALCIUM 9.2   GFR: Estimated Creatinine Clearance: 31 mL/min (A) (by C-G formula based on SCr of 1.73 mg/dL (H)).   Liver Function Tests: Recent Labs  Lab 12/24/18 0820  AST 15  ALT 25   ALKPHOS 65  BILITOT 0.8  PROT 7.8  ALBUMIN 3.4*   HbA1C: No results for input(s): HGBA1C in the last 72 hours.   Urine analysis:    Component Value Date/Time   COLORURINE YELLOW 12/24/2018 0955   APPEARANCEUR CLOUDY (A) 12/24/2018 0955   LABSPEC 1.012 12/24/2018 0955   PHURINE 7.0 12/24/2018 0955   GLUCOSEU NEGATIVE 12/24/2018 0955   HGBUR SMALL (A) 12/24/2018 0955   BILIRUBINUR NEGATIVE 12/24/2018 0955   KETONESUR NEGATIVE 12/24/2018 0955   PROTEINUR 30 (A) 12/24/2018 0955   UROBILINOGEN 0.2 12/12/2014 1134   NITRITE NEGATIVE 12/24/2018 0955   LEUKOCYTESUR LARGE (A) 12/24/2018 0955    Recent Results (from the past 240 hour(s))  Blood Culture (routine x 2)     Status: None (Preliminary result)   Collection Time: 12/24/18  8:20 AM   Specimen: Site Not Specified; Blood  Result Value Ref Range Status   Specimen Description SITE NOT SPECIFIED Blood Culture adequate volume  Final   Special Requests   Final    BOTTLES DRAWN AEROBIC AND ANAEROBIC Performed at [facility], [address], [place],  [place] [address]    Culture PENDING  Incomplete   Report Status PENDING  Incomplete  SARS Coronavirus 2 (CEPHEID- Performed in [facility] hospital lab), Hosp Order     Status: None   Collection Time: 12/24/18  8:21 AM   Specimen: Nasopharyngeal Swab  Result Value Ref Range Status   SARS Coronavirus 2 NEGATIVE NEGATIVE Final    Comment: (NOTE) If result is NEGATIVE SARS-CoV-2 target nucleic acids are NOT DETECTED. The SARS-CoV-2 RNA is generally detectable in upper and lower  respiratory specimens during the acute phase of infection. The lowest  concentration of SARS-CoV-2 viral copies this assay can detect is 250  copies / mL. A negative result does not preclude SARS-CoV-2 infection  and should not be used as the sole basis for treatment or other  patient management decisions.  A negative result may occur with  improper specimen collection / handling, submission of specimen  other  than nasopharyngeal swab, presence of viral mutation(s) within the  areas targeted by this assay, and inadequate number of viral copies  (<250 copies / mL). A negative result must be combined with clinical  observations, patient history, and epidemiological information. If result is POSITIVE SARS-CoV-2 target nucleic acids are DETECTED. The SARS-CoV-2 RNA is generally detectable in upper and lower  respiratory specimens dur ing the acute phase of infection.  Positive  results are indicative of active infection with SARS-CoV-2.  Clinical  correlation with patient history and other diagnostic information is  necessary to determine patient infection status.  Positive results do  not rule out bacterial infection or co-infection with other viruses. If result is PRESUMPTIVE POSTIVE SARS-CoV-2 nucleic acids MAY BE PRESENT.   A presumptive positive result was obtained on the submitted specimen  and confirmed on repeat testing.  While 2019 novel coronavirus  (SARS-CoV-2) nucleic acids may be present in the submitted sample  additional confirmatory testing may be necessary for epidemiological  and / or clinical management purposes  to differentiate between  SARS-CoV-2 and other Sarbecovirus currently known to infect humans.  If clinically indicated additional testing with an alternate test  methodology [phone]) is advised. The SARS-CoV-2 RNA is generally  detectable in upper and lower respiratory sp ecimens during the acute  phase of infection. The expected result is Negative. Fact Sheet for Patients:  StrictlyIdeas.no Fact Sheet for Healthcare Providers: BankingDealers.co.za This test is not yet approved or cleared by the [place] FDA and has been authorized for detection and/or diagnosis of SARS-CoV-2 by FDA under an Emergency Use Authorization (EUA).  This EUA will remain in effect (meaning this test can be used) for the duration  of the COVID-19 declaration under Section 564(b)(1) of the Act, 21 U.S.C. section 360bbb-3(b)(1), unless the authorization is terminated or revoked sooner. Performed at [facility], [address] [place], [place] [address]   Blood Culture (routine x 2)     Status: None (Preliminary result)   Collection Time: 12/24/18  8:33 AM   Specimen: Left Antecubital; Blood  Result Value Ref Range Status   Specimen Description LEFT ANTECUBITAL Blood Culture adequate volume  Final   Special Requests   Final    BOTTLES DRAWN AEROBIC AND ANAEROBIC Performed at [facility], [address], [place], [place] [address]    Culture PENDING  Incomplete   Report Status PENDING  Incomplete     Radiological Exams on Admission: Ct Abdomen Pelvis W Contrast  Result Date: 12/24/2018 CLINICAL DATA:  Abdominal pain.  Suspected diverticulitis. EXAM: CT ABDOMEN  AND PELVIS WITH CONTRAST TECHNIQUE: Multidetector CT imaging of the abdomen and pelvis was performed using the standard protocol following bolus administration of intravenous contrast. CONTRAST:  141m OMNIPAQUE IOHEXOL 300 MG/ML  SOLN COMPARISON:  None. FINDINGS: Lower chest: No acute abnormality. Hepatobiliary: No focal liver abnormality is seen. No gallstones, gallbladder wall thickening, or biliary dilatation. Pancreas: Unremarkable. No pancreatic ductal dilatation or surrounding inflammatory changes. Spleen: Normal in size without focal abnormality. Adrenals/Urinary Tract: Adrenal glands are normal. 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 No right ureterectasis or ureteral stone noted. No perinephric stranding on the right. There is fat stranding around the left kidney. Additionally, there is heterogeneous enhancement the left kidney with a striated nephrogram appearance. No hydronephrosis. No left ureteral stones are noted. The left ureter is mildly prominent. The bladder is unremarkable. Stomach/Bowel: The stomach and  small bowel are normal. The colon and appendix are normal. No diverticulitis identified. Vascular/Lymphatic: Atherosclerotic changes are seen in the nonaneurysmal aorta. A few shotty left periaortic nodes are identified without adenopathy. These nodes are probably reactive. Reproductive: Status post hysterectomy. No adnexal masses. Other: There is a fat containing periumbilical hernia. No free air or suspicious free fluid. Musculoskeletal: No acute or significant osseous findings. IMPRESSION: 1. The left perinephric stranding and striated nephrogram without hydronephrosis is consistent with pyelonephritis. Recommend clinical correlation and correlation with urinalysis. 2. Atherosclerotic change in the nonaneurysmal aorta. Shotty nodes in the left periaortic region are likely reactive. Electronically Signed   By: DDorise BullionIII M.D   On: 12/24/2018 12:10   Dg Chest Port 1 View  Result Date: 12/24/2018 CLINICAL DATA:  Cough, shortness of breath, and fever for 2 days. EXAM: PORTABLE CHEST 1 VIEW COMPARISON:  08/08/2018 FINDINGS: The heart size and mediastinal contours are within normal limits. Both lungs are clear. The visualized skeletal structures are unremarkable. IMPRESSION: No active disease. Electronically Signed   By: JEarle GellM.D.   On: 12/24/2018 09:21    EKG: none.   Assessment/Plan 1-Sepsis secondary to UTI ([facility] -patient meeting sepsis criteria -will continue IVF's -start IV antibiotics -follow culture results  -follow clinical response  -use PRN antiemetics and analgesics  2-TOBACCO USER -I have discussed tobacco cessation with the patient.  I have counseled the patient regarding the negative impacts of continued tobacco use including but not limited to lung cancer, COPD, and cardiovascular disease.  I have discussed alternatives to tobacco and modalities that may help facilitate tobacco cessation including but not limited to biofeedback, hypnosis, and medications.  Total time  spent with tobacco counseling was 4 minutes. -nicotine patch ordered  3-Asthma/chronic bronchitis  -no new infiltrates on CXR -continue PRN duoneb -no oxygen requirement currently  4-GERD -continue PPI  5-Hypertension -stable overal -will avoid nephrotoxic agents and diuretics initially -follow heart healthy diet -PRN hydralazine ordered.   6-Diabetes mellitus with retinopathy ([place], nephropathy and neuropathy -hold oral hypoglycemic agents while inpatient -will use SSI and levemir -check A1C -continue neurontin   7-Acute renal failure superimposed on stage 3 chronic kidney disease (HCC) -Cr at baseline 1.1 -in the setting of dehydration and UTI -avoid nephrotoxic agents  -treat UTI -continue IVF's -follow renal function trend    DVT prophylaxis: heparin  Code Status: Full Code  Family Communication: no family at bedside   Disposition Plan: anticipate discharge home once medically stable and sepsis resolved. Consults called: none  Admission status: inpatient, Med-Surg, LOS > 2 midnights.    Time Spent: 70 minutes  Barton Dubois MD [facility] Pager [phone]  12/24/2018, 4:22 PM

## 2018-12-24 NOTE — ED Provider Notes (Addendum)
[facility] EMERGENCY DEPARTMENT Provider Note   CSN: [id number] Arrival date & time: 12/24/18  0754    History   Chief Complaint Chief Complaint  Patient presents with  . Fever  . Shortness of Breath    HPI Barbara Wilcox is a 68 y.o. female.     HPI  The patient is a 68 year old female, she has a known history of diabetes hypertension and chronic asthma and bronchitis.  She also was a known smoker, she presents to the hospital today with a complaint of body aches and shaking chills.  She did note that she has had some coughing and shortness of breath, she has also had a couple of days of hematuria which seemed to resolve.  She has progressively worsened over the last 2 days prompting her visit here today.  She does not some travel to [place] last month, but nothing recently and states that no one around her has the same symptoms.  She has had no medicine prior to arrival.    Past Medical History:  Diagnosis Date  . Acid reflux   . Arthritis   . Asthma   . Bronchitis   . Bursitis   . Diabetes mellitus with retinopathy ([place])   . Hypertension     Patient Active Problem List   Diagnosis Date Noted  . Influenza A 08/08/2018  . Hypertension   . Diabetes mellitus with retinopathy ([place])   . Epistaxis 12/12/2014  . TOBACCO USER 05/02/2007  . Asthma 03/21/2007  . GERD 03/21/2007  . MENOPAUSE-RELATED VASOMOTOR SYMPTOMS, HOT FLASHES 03/21/2007    Past Surgical History:  Procedure Laterality Date  . ABDOMINAL HYSTERECTOMY       OB History    Gravida  1   Para  1   Term  1   Preterm      AB      Living        SAB      TAB      Ectopic      Multiple      Live Births               Home Medications    Prior to Admission medications   Medication Sig Start Date End Date Taking? Authorizing Provider  albuterol (PROVENTIL HFA;VENTOLIN HFA) 108 (90 Base) MCG/ACT inhaler Inhale 2 puffs into the lungs every 6 (six) hours as needed. For shortness of  breath. 08/09/18   Roxan Hockey, MD  diphenhydramine-acetaminophen (TYLENOL PM) 25-500 MG TABS tablet Take 1 tablet by mouth at bedtime as needed.    [provider]  gabapentin (NEURONTIN) 400 MG capsule Take 400 mg by mouth 2 (two) times daily.     [provider]  lisinopril-hydrochlorothiazide (PRINZIDE,ZESTORETIC) 10-12.5 MG tablet Take 1 tablet by mouth daily. 03/16/18   [provider]  metFORMIN (GLUCOPHAGE) 500 MG tablet Take 250 mg by mouth daily with breakfast.    [provider]  omeprazole (PRILOSEC) 20 MG capsule Take 1 capsule (20 mg total) by mouth daily. 12/20/14   Noemi Chapel, MD  predniSONE (DELTASONE) 20 MG tablet Take 1 tablet (20 mg total) by mouth daily with breakfast. 08/09/18   Roxan Hockey, MD  traZODone (DESYREL) 50 MG tablet Take 50 mg by mouth at bedtime.    [provider]    Family History Family History  Problem Relation Age of Onset  . Chronic Renal Failure Sister        on HD  . Bone  cancer Brother     Social History Social History   Tobacco Use  . Smoking status: Current Every Day Smoker    Packs/day: 0.50    Years: 36.00    Pack years: 18.00    Types: Cigarettes  . Smokeless tobacco: Never Used  Substance Use Topics  . Alcohol use: Yes    Comment: rare  . Drug use: Yes    Types: Marijuana    Comment: occasionally; last use a while ago     Allergies   Aspirin   Review of Systems Review of Systems  All other systems reviewed and are negative.    Physical Exam Updated Vital Signs BP 113/81   Pulse 97   Resp 20   Ht 1.6 m (5\' 3" )   Wt 77.1 kg   SpO2 100%   BMI 30.11 kg/m   Physical Exam Vitals signs and nursing note reviewed.  Constitutional:      General: She is not in acute distress.    Appearance: She is well-developed.  HENT:     Head: Normocephalic and atraumatic.     Mouth/Throat:     Pharynx: No oropharyngeal exudate.  Eyes:     General: No scleral icterus.        Right eye: No discharge.        Left eye: No discharge.     Conjunctiva/sclera: Conjunctivae normal.     Pupils: Pupils are equal, round, and reactive to light.  Neck:     Musculoskeletal: Normal range of motion and neck supple.     Thyroid: No thyromegaly.     Vascular: No JVD.  Cardiovascular:     Rate and Rhythm: Regular rhythm. Tachycardia present.     Heart sounds: Normal heart sounds. No murmur. No friction rub. No gallop.      Comments: Pulse of 115, normal pulses at the radial arteries, no significant edema Pulmonary:     Effort: Pulmonary effort is normal. No respiratory distress.     Breath sounds: Wheezing present. No rales.     Comments: The patient does have some expiratory wheezing but no other abnormal lung sounds, speaks in full sentences Abdominal:     General: Bowel sounds are normal. There is no distension.     Palpations: Abdomen is soft. There is no mass.     Tenderness: There is abdominal tenderness.     Comments: There is focal tenderness in the lower abdomen to palpation left more than right, no peritoneal signs, no upper abdominal tenderness guarding.  Musculoskeletal: Normal range of motion.        General: No tenderness.  Lymphadenopathy:     Cervical: No cervical adenopathy.  Skin:    General: Skin is warm and dry.     Findings: No erythema or rash.  Neurological:     Mental Status: She is alert.     Coordination: Coordination normal.     Comments: The patient has normal speech, normal gait, normal mentation, she is able to follow commands without difficulty.  Psychiatric:        Behavior: Behavior normal.      ED Treatments / Results  Labs (all labs ordered are listed, but only abnormal results are displayed) Labs Reviewed  COMPREHENSIVE METABOLIC PANEL - Abnormal; Notable for the following components:      Result Value   Glucose, Bld 127 (*)    BUN 36 (*)    Creatinine, Ser 1.73 (*)    Albumin 3.4 (*)  GFR calc non Af Amer 30 (*)     GFR calc Af Amer 35 (*)    All other components within normal limits  CBC WITH DIFFERENTIAL/PLATELET - Abnormal; Notable for the following components:   WBC 19.6 (*)    Hemoglobin 10.9 (*)    HCT 34.2 (*)    Neutro Abs 13.7 (*)    Monocytes Absolute 2.2 (*)    Abs Immature Granulocytes 0.13 (*)    All other components within normal limits  URINALYSIS, ROUTINE W REFLEX MICROSCOPIC - Abnormal; Notable for the following components:   APPearance CLOUDY (*)    Hgb urine dipstick SMALL (*)    Protein, ur 30 (*)    Leukocytes,Ua LARGE (*)    WBC, UA >50 (*)    Bacteria, UA RARE (*)    Non Squamous Epithelial 0-5 (*)    All other components within normal limits  CULTURE, BLOOD (ROUTINE X 2)  CULTURE, BLOOD (ROUTINE X 2)  SARS CORONAVIRUS 2 (HOSPITAL ORDER, [facility])  URINE CULTURE  LACTIC ACID, PLASMA    EKG None  Radiology Ct Abdomen Pelvis W Contrast  Result Date: 12/24/2018 CLINICAL DATA:  Abdominal pain.  Suspected diverticulitis. EXAM: CT ABDOMEN AND PELVIS WITH CONTRAST TECHNIQUE: Multidetector CT imaging of the abdomen and pelvis was performed using the standard protocol following bolus administration of intravenous contrast. CONTRAST:  154mL OMNIPAQUE IOHEXOL 300 MG/ML  SOLN COMPARISON:  None. FINDINGS: Lower chest: No acute abnormality. Hepatobiliary: No focal liver abnormality is seen. No gallstones, gallbladder wall thickening, or biliary dilatation. Pancreas: Unremarkable. No pancreatic ductal dilatation or surrounding inflammatory changes. Spleen: Normal in size without focal abnormality. Adrenals/Urinary Tract: Adrenal glands are normal. 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 No right ureterectasis or ureteral stone noted. No perinephric stranding on the right. There is fat stranding around the left kidney. Additionally, there is heterogeneous enhancement the left kidney with a striated nephrogram  appearance. No hydronephrosis. No left ureteral stones are noted. The left ureter is mildly prominent. The bladder is unremarkable. Stomach/Bowel: The stomach and small bowel are normal. The colon and appendix are normal. No diverticulitis identified. Vascular/Lymphatic: Atherosclerotic changes are seen in the nonaneurysmal aorta. A few shotty left periaortic nodes are identified without adenopathy. These nodes are probably reactive. Reproductive: Status post hysterectomy. No adnexal masses. Other: There is a fat containing periumbilical hernia. No free air or suspicious free fluid. Musculoskeletal: No acute or significant osseous findings. IMPRESSION: 1. The left perinephric stranding and striated nephrogram without hydronephrosis is consistent with pyelonephritis. Recommend clinical correlation and correlation with urinalysis. 2. Atherosclerotic change in the nonaneurysmal aorta. Shotty nodes in the left periaortic region are likely reactive. Electronically Signed   By: Dorise Bullion III M.D   On: 12/24/2018 12:10   Dg Chest Port 1 View  Result Date: 12/24/2018 CLINICAL DATA:  Cough, shortness of breath, and fever for 2 days. EXAM: PORTABLE CHEST 1 VIEW COMPARISON:  08/08/2018 FINDINGS: The heart size and mediastinal contours are within normal limits. Both lungs are clear. The visualized skeletal structures are unremarkable. IMPRESSION: No active disease. Electronically Signed   By: Earle Gell M.D.   On: 12/24/2018 09:21    Procedures .Critical Care Performed by: Noemi Chapel, MD Authorized by: Noemi Chapel, MD   Critical care provider statement:    Critical care time (minutes):  35   Critical care time was exclusive of:  Separately billable procedures and treating other  patients and teaching time   Critical care was necessary to treat or prevent imminent or life-threatening deterioration of the following conditions:  Sepsis   Critical care was time spent personally by me on the following  activities:  Blood draw for specimens, development of treatment plan with patient or surrogate, discussions with consultants, evaluation of patient's response to treatment, examination of patient, obtaining history from patient or surrogate, ordering and performing treatments and interventions, ordering and review of laboratory studies, ordering and review of radiographic studies, pulse oximetry, re-evaluation of patient's condition and review of old charts   Angiocath insertion Performed by: Johnna Acosta  Consent: Verbal consent obtained. - Nursing staff unable to obtain IV access.  Risks and benefits: risks, benefits and alternatives were discussed Time out: Immediately prior to procedure a "time out" was called to verify the correct patient, procedure, equipment, support staff and site/side marked as required.  Preparation: Patient was prepped and draped in the usual sterile fashion.  Vein Location:  R AC  Ultrasound Guided  Gauge: 20  Normal blood return and flush without difficulty Patient tolerance: Patient tolerated the procedure well with no immediate complications.     (including critical care time)  Medications Ordered in ED Medications  0.9 %  sodium chloride infusion (0 mLs Intravenous Stopped 12/24/18 1046)  cefTRIAXone (ROCEPHIN) 1 g in sodium chloride 0.9 % 100 mL IVPB (0 g Intravenous Stopped 12/24/18 0910)  hydrogen peroxide 3 % external solution (has no administration in time range)  acetaminophen (TYLENOL) tablet 1,000 mg (1,000 mg Oral Given 12/24/18 0837)  lactated ringers bolus 1,000 mL (1,000 mLs Intravenous New Bag/Given 12/24/18 1103)  ondansetron (ZOFRAN) injection 4 mg (4 mg Intravenous Given 12/24/18 1103)  iohexol (OMNIPAQUE) 300 MG/ML solution 80 mL (100 mLs Intravenous Contrast Given 12/24/18 1125)     Initial Impression / Assessment and Plan / ED Course  I have reviewed the triage vital signs and the nursing notes.  Pertinent labs & imaging results  that were available during my care of the patient were reviewed by me and considered in my medical decision making (see chart for details).  Clinical Course as of Dec 23 1213  Sat Dec 24, 2018  1011 The patient does have an acute kidney injury with a creatinine that is 1.73, her white blood cell count is very elevated at 19,600 with predominant neutrophils suggestive of a bacterial process.  I have personally looked at the x-ray and there is no findings of pneumonia, the coronavirus test is negative, the urinalysis is pending.  Antibiotics have been given.  We will proceed with CT scan to make sure this is not an intra-abdominal surgical problem   [BM]  1036 The patient's urinalysis shows large leukocytes, 21-50 red blood cells, more than 50 white blood cells and bacteria present.  I suspect this is the source of the infection however the CT scan is pending.  Her last blood pressure measured 92/80, pulse is down to 100, will recheck temperature as well.  IV fluid bolus given   [BM]    Clinical Course User Index [BM] Noemi Chapel, MD       The patient is acutely ill, it appears that she has a febrile illness that could be related to COVID, pneumonia, urinary tract infection or pyelonephritis or given the location of her tenderness would also consider diverticulitis perforation or abscess.  She will need further evaluation with lab studies, x-ray, coronavirus rule out and possibly a CT scan if no  other causes found.  Antibiotics started due to septic state of tachycardia and fever to 101.9.  Tylenol added.  The patient does not appear to be in shock at this time.  Discussed with hospitalist who will admit.  The patient is critically ill with sepsis though he is improving with fluids and antibiotics  Final Clinical Impressions(s) / ED Diagnoses   Final diagnoses:  Sepsis with acute renal failure without septic shock, due to unspecified organism, unspecified acute renal failure type [facility])    ED  Discharge Orders    None       Noemi Chapel, MD 12/24/18 1215    Noemi Chapel, MD 12/24/18 1224

## 2018-12-24 NOTE — ED Triage Notes (Signed)
Pt reports cough with shortness of breath and chills x 2 days. Sister is also sick with similar symptoms.

## 2018-12-24 NOTE — ED Notes (Signed)
Pt is advised a urine sample is needed, Call light at bedside.

## 2018-12-25 DIAGNOSIS — R7881 Bacteremia: Secondary | ICD-10-CM

## 2018-12-25 LAB — BLOOD CULTURE ID PANEL (REFLEXED)

## 2018-12-25 LAB — GLUCOSE, CAPILLARY
Glucose-Capillary: 118 mg/dL — ABNORMAL HIGH (ref 70–99)
Glucose-Capillary: 118 mg/dL — ABNORMAL HIGH (ref 70–99)
Glucose-Capillary: 123 mg/dL — ABNORMAL HIGH (ref 70–99)
Glucose-Capillary: 114 mg/dL — ABNORMAL HIGH (ref 70–99)

## 2018-12-25 LAB — BASIC METABOLIC PANEL
Anion gap: 12 (ref 5–15)
BUN: 24 mg/dL — ABNORMAL HIGH (ref 8–23)
CO2: 23 mmol/L (ref 22–32)
Calcium: 7.9 mg/dL — ABNORMAL LOW (ref 8.9–10.3)
Chloride: 102 mmol/L (ref 98–111)
Creatinine, Ser: 1.59 mg/dL — ABNORMAL HIGH (ref 0.44–1.00)
GFR calc Af Amer: 39 mL/min — ABNORMAL LOW (ref >60)
GFR calc non Af Amer: 33 mL/min — ABNORMAL LOW (ref >60)
Glucose, Bld: 99 mg/dL (ref 70–99)
Potassium: 3.5 mmol/L (ref 3.5–5.1)
Sodium: 137 mmol/L (ref 135–145)

## 2018-12-25 LAB — CBC
HCT: 30 % — ABNORMAL LOW (ref 36.0–46.0)
Hemoglobin: 9.3 g/dL — ABNORMAL LOW (ref 12.0–15.0)
MCH: 26.6 pg (ref 26.0–34.0)
MCHC: 31 g/dL (ref 30.0–36.0)
MCV: 86 fL (ref 80.0–100.0)
Platelets: 237 10*3/uL (ref 150–400)
RBC: 3.49 MIL/uL — ABNORMAL LOW (ref 3.87–5.11)
RDW: 15.2 % (ref 11.5–15.5)
WBC: 16.4 10*3/uL — ABNORMAL HIGH (ref 4.0–10.5)
nRBC: 0 % (ref 0.0–0.2)

## 2018-12-25 LAB — HIV ANTIBODY (ROUTINE TESTING W REFLEX): HIV Screen 4th Generation wRfx: NONREACTIVE

## 2018-12-25 LAB — HEMOGLOBIN A1C
Hgb A1c MFr Bld: 6.6 % — ABNORMAL HIGH (ref 4.8–5.6)
Mean Plasma Glucose: 142.72 mg/dL

## 2018-12-25 MED ORDER — LORATADINE 10 MG PO TABS
10.0000 mg | ORAL_TABLET | Freq: Every day | ORAL | Status: DC | PRN
  Administered 2018-12-26: 08:00:00 10 mg via ORAL
  Filled 2018-12-25: qty 1

## 2018-12-25 MED ORDER — SODIUM CHLORIDE 0.9 % IV SOLN
1.0000 g | Freq: Two times a day (BID) | INTRAVENOUS | Status: DC
  Administered 2018-12-25 – 2018-12-26 (×3): 1 g via INTRAVENOUS
  Filled 2018-12-25 (×3): qty 1

## 2018-12-25 NOTE — Progress Notes (Signed)
[facility] called to Barbara Wilcox.

## 2018-12-25 NOTE — Progress Notes (Signed)
PROGRESS NOTE    Barbara Wilcox  PPI:[id number] DOB: 09/07/1950 DOA: 12/24/2018 PCP: Alanson Puls [facility]     Brief Narrative:  68 y.o. female with past medical history significant for asthma/chronic bronchitis, gastroesophageal reflux disease, hypertension, type 2 diabetes mellitus, tobacco abuse and chronic renal failure stage III; who presented to the hospital secondary to fever, general malaise and abdominal discomfort.  Patient reports symptoms have been present for the last 3 days or so and worsening.  Abdominal pain is localized in the left flank, depressible 7/10 in intensity, no aggravating or eating factors reported, patient reported associated nausea but no vomiting and also expressed some blood in her urine for approximately 48 hours prior to admission (that is now resolved according to patient report).  Patient also reports SOB and intermittent non-productive cough; but given her hx of asthma and bronchitis, these symptoms are not uncommon on her.  Patient denies any chest pain, headaches, blurred vision, focal weakness, melena, hematochezia, or any other complaints.  In the ED patient was found to be febrile, with elevated WBCs in the 19,000 range, tachycardic and with urinalysis suggesting UTI.  Chest x-ray was negative for any acute infiltrates. CT abd and pelvis demonstrated left perinephric stranding and associated nephrogram without hydronephrosis consistent with pyelonephritis.  Patient blood work also demonstrated acute on chronic renal failure and normal lactic acid.  Since patient met sepsis criteria on presentation patient was initiated on fluid resuscitation, started on IV antibiotics after cultures were taken and TRH was called to admit patient for further evaluation and management.    Patient was COVID-test negative.  Assessment & Plan: 1-sepsis secondary to E. coli UTI/bacteremia -Still spiking fever -WBCs started to trend down and renal function  improved -Follow sensitivity on culture data -No knowing ESBL status and if still presence of active infection will broaden antibiotics to meropenem. -Continue IV fluids and supportive care.  2-tobacco abuse -Cessation counseling has been provided -Continue nicotine patch.  3-acute on chronic renal failure -Continue treatment for UTI -Continue IV fluids -Follow renal function trend; creatinine appropriately trending down.  4-asthma/chronic bronchitis -No new infiltrates on chest x-ray -Good oxygen saturation on room air appreciated -Continue PRN DuoNeb  5-gastroesophageal reflux disease -Continue PPI.  6-essential hypertension -Stable overall -Will continue avoiding nephrotoxic agents and diuretics for now. -PRN hydralazine has been ordered -Continue heart healthy diet.  7-type 2 diabetes mellitus with nephropathy and neuropathy -Continue holding oral hypoglycemic agents while inpatient -Continue the use of sliding scale insulin and Levemir -Continue Neurontin. -A1c 6.6  DVT prophylaxis: Heparin Code Status: Full code Family Communication: No family at bedside Disposition Plan: Remains inpatient, broaden IV antibiotics to the use of meropenem; follow culture data; continue IV fluids.  Consultants:   None  Procedures:   See below for x-ray reports.  Antimicrobials:  Anti-infectives (From admission, onward)   Start     Dose/Rate Route Frequency Ordered Stop   12/25/18 0815  meropenem (MERREM) 1 g in sodium chloride 0.9 % 100 mL IVPB     1 g 200 mL/hr over 30 Minutes Intravenous Every 12 hours 12/25/18 0803     12/24/18 0830  cefTRIAXone (ROCEPHIN) 1 g in sodium chloride 0.9 % 100 mL IVPB  Status:  Discontinued     1 g 200 mL/hr over 30 Minutes Intravenous Every 24 hours 12/24/18 0820 12/25/18 0803      Subjective: Patient continues spiking fever; T-max 103.1.  Intermittent nausea also reported, no vomiting.  Reports abdominal pain is  improving.  Objective: Vitals:   12/24/18 2045 12/25/18 0522 12/25/18 0652 12/25/18 0911  BP: 129/75 (!) 111/58    Pulse: (!) 116 (!) 110    Resp: 18 17    Temp: 100 F (37.8 C) (!) 103.1 F (39.5 C) 99.8 F (37.7 C)   TempSrc: Oral Oral Oral   SpO2: 96% 92%  (!) 86%  Weight:      Height:        Intake/Output Summary (Last 24 hours) at 12/25/2018 1227 Last data filed at 12/25/2018 0321 Gross per 24 hour  Intake 2603.62 ml  Output -  Net 2603.62 ml   Filed Weights   12/24/18 0801  Weight: 77.1 kg    Examination: General exam: Alert, awake, oriented x 3; still spiking fever and reporting intermittent nausea. Respiratory system: Clear to auscultation. Respiratory effort normal. Cardiovascular system:RRR. No murmurs, rubs, gallops. Gastrointestinal system: Abdomen is nondistended, soft and mild to moderate left flank pain on palpation. No organomegaly or masses felt. Normal bowel sounds heard. Central nervous system: Alert and oriented. No focal neurological deficits. Extremities: No C/C/E, +pedal pulses Skin: No rashes, lesions or ulcers Psychiatry: Judgement and insight appear normal. Mood & affect appropriate.    Data Reviewed: I have personally reviewed following labs and imaging studies  CBC: Recent Labs  Lab 12/24/18 0820 12/25/18 0612  WBC 19.6* 16.4*  NEUTROABS 13.7*  --   HGB 10.9* 9.3*  HCT 34.2* 30.0*  MCV 84.9 86.0  PLT 303 250   Basic Metabolic Panel: Recent Labs  Lab 12/24/18 0820 12/25/18 0612  NA 139 137  K 4.8 3.5  CL 101 102  CO2 25 23  GLUCOSE 127* 99  BUN 36* 24*  CREATININE 1.73* 1.59*  CALCIUM 9.2 7.9*   GFR: Estimated Creatinine Clearance: 33.8 mL/min (A) (by C-G formula based on SCr of 1.59 mg/dL (H)).   Liver Function Tests: Recent Labs  Lab 12/24/18 0820  AST 15  ALT 25  ALKPHOS 65  BILITOT 0.8  PROT 7.8  ALBUMIN 3.4*   HbA1C: Recent Labs    12/24/18 1624  HGBA1C 6.6*   CBG: Recent Labs  Lab 12/24/18 1643  12/24/18 2036 12/25/18 0721 12/25/18 1122  GLUCAP 124* 116* 118* 118*   Urine analysis:    Component Value Date/Time   COLORURINE YELLOW 12/24/2018 0955   APPEARANCEUR CLOUDY (A) 12/24/2018 0955   LABSPEC 1.012 12/24/2018 0955   PHURINE 7.0 12/24/2018 0955   GLUCOSEU NEGATIVE 12/24/2018 0955   HGBUR SMALL (A) 12/24/2018 0955   BILIRUBINUR NEGATIVE 12/24/2018 0955   KETONESUR NEGATIVE 12/24/2018 0955   PROTEINUR 30 (A) 12/24/2018 0955   UROBILINOGEN 0.2 12/12/2014 1134   NITRITE NEGATIVE 12/24/2018 0955   LEUKOCYTESUR LARGE (A) 12/24/2018 0955    Recent Results (from the past 240 hour(s))  Blood Culture (routine x 2)     Status: None (Preliminary result)   Collection Time: 12/24/18  8:20 AM   Specimen: Site Not Specified; Blood  Result Value Ref Range Status   Specimen Description   Final    SITE NOT SPECIFIED Blood Culture adequate volume Performed at [facility], [address] [place], [place] [address]    Special Requests   Final    BOTTLES DRAWN AEROBIC AND ANAEROBIC Performed at [facility], [address], [place],  [address]    Culture  Setup Time   Final    GRAM NEGATIVE RODS Gram Stain Report Called to,Read Back By and Verified With: THOMAS,K. AT 2100  ON 06/13/20520 BY EVA ANAEROBIC BOTTLE ONLY Performed at [facility] Performed at [facility], [address], [place], [place] [address]    Culture GRAM NEGATIVE RODS  Final   Report Status PENDING  Incomplete  SARS Coronavirus 2 (CEPHEID- Performed in [facility] hospital lab), Hosp Order     Status: None   Collection Time: 12/24/18  8:21 AM   Specimen: Nasopharyngeal Swab  Result Value Ref Range Status   SARS Coronavirus 2 NEGATIVE NEGATIVE Final    Comment: (NOTE) If result is NEGATIVE SARS-CoV-2 target nucleic acids are NOT DETECTED. The SARS-CoV-2 RNA is generally detectable in upper and lower  respiratory specimens during the acute phase of infection. The lowest  concentration of  SARS-CoV-2 viral copies this assay can detect is 250  copies / mL. A negative result does not preclude SARS-CoV-2 infection  and should not be used as the sole basis for treatment or other  patient management decisions.  A negative result may occur with  improper specimen collection / handling, submission of specimen other  than nasopharyngeal swab, presence of viral mutation(s) within the  areas targeted by this assay, and inadequate number of viral copies  (<250 copies / mL). A negative result must be combined with clinical  observations, patient history, and epidemiological information. If result is POSITIVE SARS-CoV-2 target nucleic acids are DETECTED. The SARS-CoV-2 RNA is generally detectable in upper and lower  respiratory specimens dur ing the acute phase of infection.  Positive  results are indicative of active infection with SARS-CoV-2.  Clinical  correlation with patient history and other diagnostic information is  necessary to determine patient infection status.  Positive results do  not rule out bacterial infection or co-infection with other viruses. If result is PRESUMPTIVE POSTIVE SARS-CoV-2 nucleic acids MAY BE PRESENT.   A presumptive positive result was obtained on the submitted specimen  and confirmed on repeat testing.  While 2019 novel coronavirus  (SARS-CoV-2) nucleic acids may be present in the submitted sample  additional confirmatory testing may be necessary for epidemiological  and / or clinical management purposes  to differentiate between  SARS-CoV-2 and other Sarbecovirus currently known to infect humans.  If clinically indicated additional testing with an alternate test  methodology [phone]) is advised. The SARS-CoV-2 RNA is generally  detectable in upper and lower respiratory sp ecimens during the acute  phase of infection. The expected result is Negative. Fact Sheet for Patients:  StrictlyIdeas.no Fact Sheet for Healthcare  Providers: BankingDealers.co.za This test is not yet approved or cleared by the [place] FDA and has been authorized for detection and/or diagnosis of SARS-CoV-2 by FDA under an Emergency Use Authorization (EUA).  This EUA will remain in effect (meaning this test can be used) for the duration of the COVID-19 declaration under Section 564(b)(1) of the Act, 21 U.S.C. section 360bbb-3(b)(1), unless the authorization is terminated or revoked sooner. Performed at [facility], [address], [place], [place] [address]   Blood Culture (routine x 2)     Status: Abnormal (Preliminary result)   Collection Time: 12/24/18  8:33 AM   Specimen: Left Antecubital; Blood  Result Value Ref Range Status   Specimen Description   Final    LEFT ANTECUBITAL Blood Culture adequate volume Performed at [facility], [address] [place], [place] [address]    Special Requests   Final    BOTTLES DRAWN AEROBIC AND ANAEROBIC Performed at [facility], [address] [place], [place] [address]  Culture  Setup Time   Final    GRAM NEGATIVE RODS Gram Stain Report Called to,Read Back By and Verified With: THOMAS,K. AT 2100 ON 12/24/2018 BY EVA IN BOTH AEROBIC AND ANAEROBIC BOTTLES Performed at [place], READ BACK BY AND VERIFIED WITH: RN S HANDY @ 0234 12/25/18 BY S GEZAHEGN Performed at [facility], [place] [address] [place], [place] [address]    Culture ESCHERICHIA COLI (A)  Final   Report Status PENDING  Incomplete  Blood Culture ID Panel (Reflexed)     Status: Abnormal   Collection Time: 12/24/18  8:33 AM  Result Value Ref Range Status   Enterococcus species NOT DETECTED NOT DETECTED Final   Listeria monocytogenes NOT DETECTED NOT DETECTED Final   Staphylococcus species NOT DETECTED NOT DETECTED Final   Staphylococcus aureus (BCID) NOT DETECTED NOT DETECTED Final   Streptococcus species NOT DETECTED NOT DETECTED Final   Streptococcus  agalactiae NOT DETECTED NOT DETECTED Final   Streptococcus pneumoniae NOT DETECTED NOT DETECTED Final   Streptococcus pyogenes NOT DETECTED NOT DETECTED Final   Acinetobacter baumannii NOT DETECTED NOT DETECTED Final   Enterobacteriaceae species DETECTED (A) NOT DETECTED Final    Comment: Enterobacteriaceae represent a large family of gram-negative bacteria, not a single organism. CRITICAL RESULT CALLED TO, READ BACK BY AND VERIFIED WITH: RN S HANDY @ 0234 12/25/18 BY S GEZAHEGN    Enterobacter cloacae complex NOT DETECTED NOT DETECTED Final   Escherichia coli DETECTED (A) NOT DETECTED Final    Comment: CRITICAL RESULT CALLED TO, READ BACK BY AND VERIFIED WITH: RN S HANDY @ 0234 12/25/18 BY S GEZAHEGN    Klebsiella oxytoca NOT DETECTED NOT DETECTED Final   Klebsiella pneumoniae NOT DETECTED NOT DETECTED Final   Proteus species NOT DETECTED NOT DETECTED Final   Serratia marcescens NOT DETECTED NOT DETECTED Final   Carbapenem resistance NOT DETECTED NOT DETECTED Final   Haemophilus influenzae NOT DETECTED NOT DETECTED Final   Neisseria meningitidis NOT DETECTED NOT DETECTED Final   Pseudomonas aeruginosa NOT DETECTED NOT DETECTED Final   Candida albicans NOT DETECTED NOT DETECTED Final   Candida glabrata NOT DETECTED NOT DETECTED Final   Candida krusei NOT DETECTED NOT DETECTED Final   Candida parapsilosis NOT DETECTED NOT DETECTED Final   Candida tropicalis NOT DETECTED NOT DETECTED Final    Comment: Performed at [facility], [place]. [address] [place], [place] [address]  Urine culture     Status: None (Preliminary result)   Collection Time: 12/24/18  9:55 AM   Specimen: Urine, Clean Catch  Result Value Ref Range Status   Specimen Description   Final    URINE, CLEAN CATCH Performed at [facility], [address], [place], [place] [address]    Special Requests   Final    NONE Performed at [facility], [place] [address] [place],  [address]    Culture PENDING   Incomplete   Report Status PENDING  Incomplete     Radiology Studies: Ct Abdomen Pelvis W Contrast  Result Date: 12/24/2018 CLINICAL DATA:  Abdominal pain.  Suspected diverticulitis. EXAM: CT ABDOMEN AND PELVIS WITH CONTRAST TECHNIQUE: Multidetector CT imaging of the abdomen and pelvis was performed using the standard protocol following bolus administration of intravenous contrast. CONTRAST:  154m OMNIPAQUE IOHEXOL 300 MG/ML  SOLN COMPARISON:  None. FINDINGS: Lower chest: No acute abnormality. Hepatobiliary: No focal liver abnormality is seen. No gallstones, gallbladder wall thickening, or biliary dilatation. Pancreas:  Unremarkable. No pancreatic ductal dilatation or surrounding inflammatory changes. Spleen: Normal in size without focal abnormality. Adrenals/Urinary Tract: Adrenal glands are normal. 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 No right ureterectasis or ureteral stone noted. No perinephric stranding on the right. There is fat stranding around the left kidney. Additionally, there is heterogeneous enhancement the left kidney with a striated nephrogram appearance. No hydronephrosis. No left ureteral stones are noted. The left ureter is mildly prominent. The bladder is unremarkable. Stomach/Bowel: The stomach and small bowel are normal. The colon and appendix are normal. No diverticulitis identified. Vascular/Lymphatic: Atherosclerotic changes are seen in the nonaneurysmal aorta. A few shotty left periaortic nodes are identified without adenopathy. These nodes are probably reactive. Reproductive: Status post hysterectomy. No adnexal masses. Other: There is a fat containing periumbilical hernia. No free air or suspicious free fluid. Musculoskeletal: No acute or significant osseous findings. IMPRESSION: 1. The left perinephric stranding and striated nephrogram without hydronephrosis is consistent with pyelonephritis. Recommend clinical correlation and  correlation with urinalysis. 2. Atherosclerotic change in the nonaneurysmal aorta. Shotty nodes in the left periaortic region are likely reactive. Electronically Signed   By: Dorise Bullion III M.D   On: 12/24/2018 12:10   Dg Chest Port 1 View  Result Date: 12/24/2018 CLINICAL DATA:  Cough, shortness of breath, and fever for 2 days. EXAM: PORTABLE CHEST 1 VIEW COMPARISON:  08/08/2018 FINDINGS: The heart size and mediastinal contours are within normal limits. Both lungs are clear. The visualized skeletal structures are unremarkable. IMPRESSION: No active disease. Electronically Signed   By: Earle Gell M.D.   On: 12/24/2018 09:21    Scheduled Meds: . gabapentin  400 mg Oral BID  . heparin injection (subcutaneous)  5,000 Units Subcutaneous Q8H  . insulin aspart  0-9 Units Subcutaneous TID WC  . insulin detemir  5 Units Subcutaneous QHS  . nicotine  21 mg Transdermal Daily  . pantoprazole  40 mg Oral Daily  . traZODone  50 mg Oral QHS   Continuous Infusions: . sodium chloride 1,000 mL (12/25/18 0751)  . meropenem (MERREM) IV 1 g (12/25/18 0836)     LOS: 1 day    Time spent: 35 minutes. Greater than 50% of this time was spent in direct contact with the patient, coordinating care and discussing relevant ongoing clinical issues, including positive blood cultures and concern for bacteremia; patient continues spiking fever and reporting mild intermittent nausea.  Nursing staff updated at bedside about plan of care.  Continue IV antibiotics (with concerns for bacteremia now and no knowing ESBL status will broaden antibiotics to meropenem and follow clinical response).   Barton Dubois, MD [facility] Pager [phone]   12/25/2018, 12:27 PM

## 2018-12-26 LAB — CULTURE, BLOOD (ROUTINE X 2): Specimen Description: ADEQUATE

## 2018-12-26 LAB — GLUCOSE, CAPILLARY
Glucose-Capillary: 144 mg/dL — ABNORMAL HIGH (ref 70–99)
Glucose-Capillary: 116 mg/dL — ABNORMAL HIGH (ref 70–99)
Glucose-Capillary: 114 mg/dL — ABNORMAL HIGH (ref 70–99)
Glucose-Capillary: 113 mg/dL — ABNORMAL HIGH (ref 70–99)

## 2018-12-26 LAB — URINE CULTURE

## 2018-12-26 MED ORDER — LEVOFLOXACIN 500 MG PO TABS
500.0000 mg | ORAL_TABLET | Freq: Every day | ORAL | Status: DC
  Administered 2018-12-26 – 2018-12-27 (×2): 500 mg via ORAL
  Filled 2018-12-26 (×2): qty 1

## 2018-12-26 MED ORDER — DOCUSATE SODIUM 100 MG PO CAPS
100.0000 mg | ORAL_CAPSULE | Freq: Two times a day (BID) | ORAL | Status: DC
  Administered 2018-12-26 – 2018-12-27 (×3): 100 mg via ORAL
  Filled 2018-12-26 (×3): qty 1

## 2018-12-26 MED ORDER — BENZOCAINE 10 % MT GEL
Freq: Four times a day (QID) | OROMUCOSAL | Status: DC | PRN
  Administered 2018-12-26: 11:00:00 via OROMUCOSAL
  Filled 2018-12-26: qty 9

## 2018-12-26 MED ORDER — SODIUM CHLORIDE 0.9 % IV SOLN
1000.0000 mL | INTRAVENOUS | Status: DC
  Administered 2018-12-26 (×2): 1000 mL via INTRAVENOUS

## 2018-12-26 MED ORDER — POLYETHYLENE GLYCOL 3350 17 G PO PACK
17.0000 g | PACK | Freq: Every day | ORAL | Status: DC | PRN
  Administered 2018-12-26: 17 g via ORAL
  Filled 2018-12-26: qty 1

## 2018-12-26 NOTE — Care Management Important Message (Signed)
Important Message  Patient Details  Name: Barbara Wilcox MRN: [id number] Date of Birth: 01-03-1951   Medicare Important Message Given:  Yes    Tommy Medal 12/26/2018, 12:34 PM

## 2018-12-26 NOTE — Progress Notes (Signed)
PROGRESS NOTE    Barbara Wilcox  FOY:[id number] DOB: [birth year] DOA: 12/24/2018 PCP: Barbara Wilcox [facility]     Brief Narrative:  68 y.o. female with past medical history significant for asthma/chronic bronchitis, gastroesophageal reflux disease, hypertension, type 2 diabetes mellitus, tobacco abuse and chronic renal failure stage III; who presented to the hospital secondary to fever, general malaise and abdominal discomfort.  Patient reports symptoms have been present for the last 3 days or so and worsening.  Abdominal pain is localized in the left flank, depressible 7/10 in intensity, no aggravating or eating factors reported, patient reported associated nausea but no vomiting and also expressed some blood in her urine for approximately 48 hours prior to admission (that is now resolved according to patient report).  Patient also reports SOB and intermittent non-productive cough; but given her hx of asthma and bronchitis, these symptoms are not uncommon on her.  Patient denies any chest pain, headaches, blurred vision, focal weakness, melena, hematochezia, or any other complaints.  In the ED patient was found to be febrile, with elevated WBCs in the 19,000 range, tachycardic and with urinalysis suggesting UTI.  Chest x-ray was negative for any acute infiltrates. CT abd and pelvis demonstrated left perinephric stranding and associated nephrogram without hydronephrosis consistent with pyelonephritis.  Patient blood work also demonstrated acute on chronic renal failure and normal lactic acid.  Since patient met sepsis criteria on presentation patient was initiated on fluid resuscitation, started on IV antibiotics after cultures were taken and TRH was called to admit patient for further evaluation and management.    Patient was COVID-test negative.  Assessment & Plan: 1-sepsis secondary to E. coli UTI/bacteremia -WBCs trending down and patient afebrile now.  -sensitivity demonstrating E. Coli  resistant to ampicillin and bactrim  -will attempt oral antibiotics using Levaquin as recommended by ID. Plan is for a total of 10 days.  -Continue IV fluids at adjusted rate and continue supportive care.  2-tobacco abuse -Cessation counseling has been provided -Continue nicotine patch. -patient motivated to quit.   3-acute on chronic renal failure -Continue treatment for UTI -Continue IV fluids, but adjust rate. -Follow renal function trend; creatinine appropriately trending down.  4-asthma/chronic bronchitis -No new infiltrates on chest x-ray -Good oxygen saturation on room air appreciated -Continue PRN DuoNeb  5-gastroesophageal reflux disease -Continue PPI.  6-essential hypertension -Stable overall -Will continue avoiding nephrotoxic agents and diuretics for now. -PRN hydralazine has been ordered -Continue heart healthy diet.  7-type 2 diabetes mellitus with nephropathy and neuropathy -Continue holding oral hypoglycemic agents while inpatient -Continue the use of sliding scale insulin and Levemir -CBG's well controlled.  -Continue Neurontin. -A1c 6.6  DVT prophylaxis: Heparin Code Status: Full code Family Communication: No family at bedside Disposition Plan: Remains inpatient, attempt oral antibiotics and observe tolerance, decrease IVF's and encourage oral intake. Hopefully home in am.   Consultants:   ID curbside (Dr. Johnnye Sima; recommended to treat with fluoroquinolones).  Procedures:   See below for x-ray reports.  Antimicrobials:  Anti-infectives (From admission, onward)   Start     Dose/Rate Route Frequency Ordered Stop   12/26/18 1100  levofloxacin (LEVAQUIN) tablet 500 mg     500 mg Oral Daily 12/26/18 1058     12/25/18 0815  meropenem (MERREM) 1 g in sodium chloride 0.9 % 100 mL IVPB  Status:  Discontinued     1 g 200 mL/hr over 30 Minutes Intravenous Every 12 hours 12/25/18 0803 12/26/18 1058   12/24/18 0830  cefTRIAXone (ROCEPHIN)  1 g in sodium  chloride 0.9 % 100 mL IVPB  Status:  Discontinued     1 g 200 mL/hr over 30 Minutes Intravenous Every 24 hours 12/24/18 0820 12/25/18 0803      Subjective: Patient afebrile, no CP, no SOB. No further vomiting, but mild nausea reported.   Objective: Vitals:   12/25/18 2057 12/26/18 0500 12/26/18 0500 12/26/18 0806  BP: (!) 120/57  122/63   Pulse: 100  99   Resp: 20  20   Temp: 99.3 F (37.4 C)  100 F (37.8 C)   TempSrc: Oral  Oral   SpO2: 95%  100% 93%  Weight:  80.4 kg    Height:        Intake/Output Summary (Last 24 hours) at 12/26/2018 1058 Last data filed at 12/26/2018 0914 Gross per 24 hour  Intake 1966.99 ml  Output 650 ml  Net 1316.99 ml   Filed Weights   12/24/18 0801 12/26/18 0500  Weight: 77.1 kg 80.4 kg    Examination: General exam: Alert, awake, oriented x 3; reporting very little nausea and no vomiting. Afebrile currently.  Respiratory system: Clear to auscultation. Respiratory effort normal. Cardiovascular system:RRR. No murmurs, rubs, gallops. Gastrointestinal system: Abdomen is nondistended, soft and nontenderness. No organomegaly or masses felt. Normal bowel sounds heard. Central nervous system: Alert and oriented. No focal neurological deficits. Extremities: No C/C/E, +pedal pulses Skin: No rashes, lesions or ulcers Psychiatry: Judgement and insight appear normal. Mood & affect appropriate.   Data Reviewed: I have personally reviewed following labs and imaging studies  CBC: Recent Labs  Lab 12/24/18 0820 12/25/18 0612  WBC 19.6* 16.4*  NEUTROABS 13.7*  --   HGB 10.9* 9.3*  HCT 34.2* 30.0*  MCV 84.9 86.0  PLT 303 237   Basic Metabolic Panel: Recent Labs  Lab 12/24/18 0820 12/25/18 0612  NA 139 137  K 4.8 3.5  CL 101 102  CO2 25 23  GLUCOSE 127* 99  BUN 36* 24*  CREATININE 1.73* 1.59*  CALCIUM 9.2 7.9*   GFR: Estimated Creatinine Clearance: 34.5 mL/min (A) (by C-G formula based on SCr of 1.59 mg/dL (H)).   Liver Function  Tests: Recent Labs  Lab 12/24/18 0820  AST 15  ALT 25  ALKPHOS 65  BILITOT 0.8  PROT 7.8  ALBUMIN 3.4*   HbA1C: Recent Labs    12/24/18 1624  HGBA1C 6.6*   CBG: Recent Labs  Lab 12/25/18 0721 12/25/18 1122 12/25/18 1608 12/25/18 2100 12/26/18 0725  GLUCAP 118* 118* 123* 114* 144*   Urine analysis:    Component Value Date/Time   COLORURINE YELLOW 12/24/2018 0955   APPEARANCEUR CLOUDY (A) 12/24/2018 0955   LABSPEC 1.012 12/24/2018 0955   PHURINE 7.0 12/24/2018 0955   GLUCOSEU NEGATIVE 12/24/2018 0955   HGBUR SMALL (A) 12/24/2018 0955   BILIRUBINUR NEGATIVE 12/24/2018 0955   KETONESUR NEGATIVE 12/24/2018 0955   PROTEINUR 30 (A) 12/24/2018 0955   UROBILINOGEN 0.2 12/12/2014 1134   NITRITE NEGATIVE 12/24/2018 0955   LEUKOCYTESUR LARGE (A) 12/24/2018 0955    Recent Results (from the past 240 hour(s))  Blood Culture (routine x 2)     Status: Abnormal   Collection Time: 12/24/18  8:20 AM   Specimen: Site Not Specified; Blood  Result Value Ref Range Status   Specimen Description   Final    SITE NOT SPECIFIED Blood Culture adequate volume Performed at [facility], [address] [place], [place] [address]    Special Requests   Final      BOTTLES DRAWN AEROBIC AND ANAEROBIC Performed at [facility], [address], [place], [place] [address]    Culture  Setup Time   Final    GRAM NEGATIVE RODS Gram Stain Report Called to,Read Back By and Verified With: THOMAS,K. AT 2100 ON 06/13/20520 BY EVA ANAEROBIC BOTTLE ONLY Performed at [facility] Performed at [facility], [address], [place], [place] [address]    Culture (A)  Final    ESCHERICHIA COLI SUSCEPTIBILITIES PERFORMED ON PREVIOUS CULTURE WITHIN THE LAST 5 DAYS. Performed at [facility], [place] [address] [place], [place] [address]    Report Status 12/26/2018 FINAL  Final  SARS Coronavirus 2 (CEPHEID- Performed in [facility] lab), Hosp Order     Status: None   Collection Time:  12/24/18  8:21 AM   Specimen: Nasopharyngeal Swab  Result Value Ref Range Status   SARS Coronavirus 2 NEGATIVE NEGATIVE Final    Comment: (NOTE) If result is NEGATIVE SARS-CoV-2 target nucleic acids are NOT DETECTED. The SARS-CoV-2 RNA is generally detectable in upper and lower  respiratory specimens during the acute phase of infection. The lowest  concentration of SARS-CoV-2 viral copies this assay can detect is 250  copies / mL. A negative result does not preclude SARS-CoV-2 infection  and should not be used as the sole basis for treatment or other  patient management decisions.  A negative result may occur with  improper specimen collection / handling, submission of specimen other  than nasopharyngeal swab, presence of viral mutation(s) within the  areas targeted by this assay, and inadequate number of viral copies  (<250 copies / mL). A negative result must be combined with clinical  observations, patient history, and epidemiological information. If result is POSITIVE SARS-CoV-2 target nucleic acids are DETECTED. The SARS-CoV-2 RNA is generally detectable in upper and lower  respiratory specimens dur ing the acute phase of infection.  Positive  results are indicative of active infection with SARS-CoV-2.  Clinical  correlation with patient history and other diagnostic information is  necessary to determine patient infection status.  Positive results do  not rule out bacterial infection or co-infection with other viruses. If result is PRESUMPTIVE POSTIVE SARS-CoV-2 nucleic acids MAY BE PRESENT.   A presumptive positive result was obtained on the submitted specimen  and confirmed on repeat testing.  While 2019 novel coronavirus  (SARS-CoV-2) nucleic acids may be present in the submitted sample  additional confirmatory testing may be necessary for epidemiological  and / or clinical management purposes  to differentiate between  SARS-CoV-2 and other Sarbecovirus currently known to  infect humans.  If clinically indicated additional testing with an alternate test  methodology [phone]) is advised. The SARS-CoV-2 RNA is generally  detectable in upper and lower respiratory sp ecimens during the acute  phase of infection. The expected result is Negative. Fact Sheet for Patients:  StrictlyIdeas.no Fact Sheet for Healthcare Providers: BankingDealers.co.za This test is not yet approved or cleared by the [place] FDA and has been authorized for detection and/or diagnosis of SARS-CoV-2 by FDA under an Emergency Use Authorization (EUA).  This EUA will remain in effect (meaning this test can be used) for the duration of the COVID-19 declaration under Section 564(b)(1) of the Act, 21 U.S.C. section 360bbb-3(b)(1), unless the authorization is terminated or revoked sooner. Performed at [facility], [address] [place], [place] [address]   Blood Culture (routine x 2)     Status: Abnormal   Collection Time: 12/24/18  8:33 AM   Specimen: Left Antecubital; Blood  Result Value Ref Range Status   Specimen Description   Final    LEFT ANTECUBITAL Blood Culture adequate volume Performed at [facility], [address] [place], [place] [address]    Special Requests   Final    BOTTLES DRAWN AEROBIC AND ANAEROBIC Performed at [facility], [address] [place], [place] [address]    Culture  Setup Time   Final    GRAM NEGATIVE RODS Gram Stain Report Called to,Read Back By and Verified With: THOMAS,K. AT 2100 ON 12/24/2018 BY EVA IN BOTH AEROBIC AND ANAEROBIC BOTTLES Performed at [place], READ BACK BY AND VERIFIED WITH: RN S HANDY @ 0234 12/25/18 BY Tobey Bride Performed at [facility], [place] [address] [place], [place] [address]    Culture ESCHERICHIA COLI (A)  Final   Report Status 12/26/2018 FINAL  Final   Organism ID, Bacteria ESCHERICHIA COLI  Final      Susceptibility    Escherichia coli - MIC*    AMPICILLIN >=32 RESISTANT Resistant     CEFAZOLIN <=4 SENSITIVE Sensitive     CEFEPIME <=1 SENSITIVE Sensitive     CEFTAZIDIME <=1 SENSITIVE Sensitive     CEFTRIAXONE <=1 SENSITIVE Sensitive     CIPROFLOXACIN 1 SENSITIVE Sensitive     GENTAMICIN <=1 SENSITIVE Sensitive     IMIPENEM <=0.25 SENSITIVE Sensitive     TRIMETH/SULFA >=320 RESISTANT Resistant     AMPICILLIN/SULBACTAM 16 INTERMEDIATE Intermediate     PIP/TAZO <=4 SENSITIVE Sensitive     Extended ESBL NEGATIVE Sensitive     * ESCHERICHIA COLI  Blood Culture ID Panel (Reflexed)     Status: Abnormal   Collection Time: 12/24/18  8:33 AM  Result Value Ref Range Status   Enterococcus species NOT DETECTED NOT DETECTED Final   Listeria monocytogenes NOT DETECTED NOT DETECTED Final   Staphylococcus species NOT DETECTED NOT DETECTED Final   Staphylococcus aureus (BCID) NOT DETECTED NOT DETECTED Final   Streptococcus species NOT DETECTED NOT DETECTED Final   Streptococcus agalactiae NOT DETECTED NOT DETECTED Final   Streptococcus pneumoniae NOT DETECTED NOT DETECTED Final   Streptococcus pyogenes NOT DETECTED NOT DETECTED Final   Acinetobacter baumannii NOT DETECTED NOT DETECTED Final   Enterobacteriaceae species DETECTED (A) NOT DETECTED Final    Comment: Enterobacteriaceae represent a large family of gram-negative bacteria, not a single organism. CRITICAL RESULT CALLED TO, READ BACK BY AND VERIFIED WITH: RN S HANDY @ 0234 12/25/18 BY S GEZAHEGN    Enterobacter cloacae complex NOT DETECTED NOT DETECTED Final   Escherichia coli DETECTED (A) NOT DETECTED Final    Comment: CRITICAL RESULT CALLED TO, READ BACK BY AND VERIFIED WITH: RN S HANDY @ 0234 12/25/18 BY S GEZAHEGN    Klebsiella oxytoca NOT DETECTED NOT DETECTED Final   Klebsiella pneumoniae NOT DETECTED NOT DETECTED Final   Proteus species NOT DETECTED NOT DETECTED Final   Serratia marcescens NOT DETECTED NOT DETECTED Final   Carbapenem resistance  NOT DETECTED NOT DETECTED Final   Haemophilus influenzae NOT DETECTED NOT DETECTED Final   Neisseria meningitidis NOT DETECTED NOT DETECTED Final   Pseudomonas aeruginosa NOT DETECTED NOT DETECTED Final   Candida albicans NOT DETECTED NOT DETECTED Final   Candida glabrata NOT DETECTED NOT DETECTED Final   Candida krusei NOT DETECTED NOT DETECTED Final   Candida parapsilosis NOT DETECTED NOT DETECTED Final   Candida tropicalis NOT DETECTED NOT DETECTED Final  Comment: Performed at [facility], [place] [address], [place], [place] [address]  Urine culture     Status: Abnormal   Collection Time: 12/24/18  9:55 AM   Specimen: Urine, Clean Catch  Result Value Ref Range Status   Specimen Description   Final    URINE, CLEAN CATCH Performed at [facility], [address] [place], [place] [address]    Special Requests   Final    NONE Performed at [facility], [place] [address], [place], [place] [address]    Culture MULTIPLE SPECIES PRESENT, SUGGEST RECOLLECTION (A)  Final   Report Status 12/26/2018 FINAL  Final     Radiology Studies: Ct Abdomen Pelvis W Contrast  Result Date: 12/24/2018 CLINICAL DATA:  Abdominal pain.  Suspected diverticulitis. EXAM: CT ABDOMEN AND PELVIS WITH CONTRAST TECHNIQUE: Multidetector CT imaging of the abdomen and pelvis was performed using the standard protocol following bolus administration of intravenous contrast. CONTRAST:  174m OMNIPAQUE IOHEXOL 300 MG/ML  SOLN COMPARISON:  None. FINDINGS: Lower chest: No acute abnormality. Hepatobiliary: No focal liver abnormality is seen. No gallstones, gallbladder wall thickening, or biliary dilatation. Pancreas: Unremarkable. No pancreatic ductal dilatation or surrounding inflammatory changes. Spleen: Normal in size without focal abnormality. Adrenals/Urinary Tract: Adrenal glands are normal. 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 No right ureterectasis or ureteral stone  noted. No perinephric stranding on the right. There is fat stranding around the left kidney. Additionally, there is heterogeneous enhancement the left kidney with a striated nephrogram appearance. No hydronephrosis. No left ureteral stones are noted. The left ureter is mildly prominent. The bladder is unremarkable. Stomach/Bowel: The stomach and small bowel are normal. The colon and appendix are normal. No diverticulitis identified. Vascular/Lymphatic: Atherosclerotic changes are seen in the nonaneurysmal aorta. A few shotty left periaortic nodes are identified without adenopathy. These nodes are probably reactive. Reproductive: Status post hysterectomy. No adnexal masses. Other: There is a fat containing periumbilical hernia. No free air or suspicious free fluid. Musculoskeletal: No acute or significant osseous findings. IMPRESSION: 1. The left perinephric stranding and striated nephrogram without hydronephrosis is consistent with pyelonephritis. Recommend clinical correlation and correlation with urinalysis. 2. Atherosclerotic change in the nonaneurysmal aorta. Shotty nodes in the left periaortic region are likely reactive. Electronically Signed   By: DDorise BullionIII M.D   On: 12/24/2018 12:10    Scheduled Meds: . gabapentin  400 mg Oral BID  . heparin injection (subcutaneous)  5,000 Units Subcutaneous Q8H  . insulin aspart  0-9 Units Subcutaneous TID WC  . insulin detemir  5 Units Subcutaneous QHS  . levofloxacin  500 mg Oral Daily  . nicotine  21 mg Transdermal Daily  . pantoprazole  40 mg Oral Daily  . traZODone  50 mg Oral QHS   Continuous Infusions: . sodium chloride       LOS: 2 days    Time spent: 30 minutes.   CBarton Dubois MD [facility] Pager [phone]  12/26/2018, 10:58 AM

## 2018-12-27 DIAGNOSIS — R7881 Bacteremia: Secondary | ICD-10-CM

## 2018-12-27 DIAGNOSIS — B9620 Unspecified Escherichia coli [E. coli] as the cause of diseases classified elsewhere: Secondary | ICD-10-CM

## 2018-12-27 LAB — GLUCOSE, CAPILLARY: Glucose-Capillary: 101 mg/dL — ABNORMAL HIGH (ref 70–99)

## 2018-12-27 LAB — CBC
HCT: 28.7 % — ABNORMAL LOW (ref 36.0–46.0)
Hemoglobin: 9 g/dL — ABNORMAL LOW (ref 12.0–15.0)
MCH: 26.9 pg (ref 26.0–34.0)
MCHC: 31.4 g/dL (ref 30.0–36.0)
MCV: 85.9 fL (ref 80.0–100.0)
Platelets: 238 10*3/uL (ref 150–400)
RBC: 3.34 MIL/uL — ABNORMAL LOW (ref 3.87–5.11)
RDW: 15.6 % — ABNORMAL HIGH (ref 11.5–15.5)
WBC: 9.2 10*3/uL (ref 4.0–10.5)
nRBC: 0 % (ref 0.0–0.2)

## 2018-12-27 LAB — BASIC METABOLIC PANEL
Anion gap: 7 (ref 5–15)
BUN: 13 mg/dL (ref 8–23)
CO2: 25 mmol/L (ref 22–32)
Calcium: 8.4 mg/dL — ABNORMAL LOW (ref 8.9–10.3)
Chloride: 110 mmol/L (ref 98–111)
Creatinine, Ser: 1.14 mg/dL — ABNORMAL HIGH (ref 0.44–1.00)
GFR calc Af Amer: 58 mL/min — ABNORMAL LOW (ref >60)
GFR calc non Af Amer: 50 mL/min — ABNORMAL LOW (ref >60)
Glucose, Bld: 105 mg/dL — ABNORMAL HIGH (ref 70–99)
Potassium: 3.9 mmol/L (ref 3.5–5.1)
Sodium: 142 mmol/L (ref 135–145)

## 2018-12-27 MED ORDER — LEVOFLOXACIN 500 MG PO TABS: 500.0000 mg | ORAL_TABLET | Freq: Every day | ORAL | 0 refills | Status: AC

## 2018-12-27 MED ORDER — NICOTINE 21 MG/24HR TD PT24: 21.0000 mg | MEDICATED_PATCH | Freq: Every day | TRANSDERMAL | 1 refills | Status: DC

## 2018-12-27 NOTE — Discharge Summary (Signed)
Physician Discharge Summary  Barbara Wilcox QBH:[id number] DOB: [birth year] DOA: 12/24/2018  PCP: Alanson Puls [place] date: 12/24/2018 Discharge date: 12/27/2018  Time spent: 35 minutes  Recommendations for Outpatient Follow-up:  1. Repeat basic metabolic panel to follow renal function trend electrolytes 2. Reassess blood pressure and further adjust antihypertensive regimen as needed 3. Continue assisting patient with a smoking cessation journey.   Discharge Diagnoses:  Principal Problem:   Sepsis secondary to UTI [facility]) Active Problems:   TOBACCO USER   Asthma   GERD   Hypertension   Type 2 diabetes with nephropathy ([place])   Acute renal failure superimposed on stage 3 chronic kidney disease ([place])   E coli bacteremia   Discharge Condition: Stable and improved.  Patient discharged home with instruction to follow-up with PCP in 10 days.  Diet recommendation: Healthy modified carbohydrates diet.  Filed Weights   12/24/18 0801 12/26/18 0500  Weight: 77.1 kg 80.4 kg    History of present illness:  68 y.o.femalewith past medical history significant for asthma/chronic bronchitis, gastroesophageal reflux disease, hypertension, type 2 diabetes mellitus,tobacco abuse and chronic renal failure stage III; who presented to the hospital secondary to fever, general malaise and abdominal discomfort. Patient reports symptoms have been present for the last 3 days or so and worsening. Abdominal pain is localized in the left flank, depressible 7/10 in intensity, no aggravating or eating factors reported, patient reported associated nausea but no vomiting and also expressed some blood in her urine for approximately 48 hours prior to admission (that is now resolved according to patient report). Patient also reports SOB and intermittent non-productive cough; but given her hx of asthma and bronchitis, these symptoms are not uncommon on her. Patient denies any chest pain, headaches,  blurred vision, focal weakness, melena, hematochezia, or any other complaints.  In the ED patient was found to be febrile,with elevated WBCs in the 19,000 range, tachycardic and with urinalysis suggesting UTI. Chest x-ray was negative for any acute infiltrates. CT abd and pelvis demonstratedleft perinephric stranding and associated nephrogram without hydronephrosis consistent with pyelonephritis. Patient blood work also demonstrated acute on chronic renal failure and normal lactic acid. Since patient met sepsis criteria on presentation patient was initiated on fluid resuscitation, started on IV antibiotics after cultures were taken and TRH wascalled to admit patient for further evaluation and management.   Patient was COVID-test negative.  Hospital Course:  1-sepsis secondary to E. coli UTI/bacteremia -Patient afebrile, WBC is back to normal range at time of discharge. -No dysuria, no nausea, no vomiting. -Has tolerated antibiotic by mouth without any problems prior to discharge. -sensitivity demonstrating E. Coli resistant to ampicillin and bactrim  -will attempt oral antibiotics using Levaquin as recommended by ID. Plan is for a total of 10 days.  -Patient advised to maintain adequate hydration.  2-tobacco abuse -Cessation counseling has been provided -Continue nicotine patch. -patient motivated to quit.   3-acute on chronic renal failure: Stage III at baseline. -Continue treatment for UTI -Continue adequate hydration -Follow renal function trend at follow-up visit to reassess stability. -Creatinine at discharge 1.14.  4-asthma/chronic bronchitis -No new infiltrates on chest x-ray -Good oxygen saturation on room air appreciated -Continue PRN albuterol.  5-gastroesophageal reflux disease -Continue PPI.  6-essential hypertension -Stable overall and rising at discharge. -Resume home antihypertensive regimen. -Follow-up vital signs at follow-up visit and further  adjust therapy as needed. -Continue heart healthy diet.  7-type 2 diabetes mellitus with nephropathy and neuropathy -CBG's well controlled.  -Resume home  hypoglycemic regimen. -Continue Neurontin. -A1c 6.6  Procedures:  Infectious disease service was curbside over the phone (Dr. Johnnye Sima).  Consultations:  None  Discharge Exam: Vitals:   12/26/18 2202 12/27/18 0528  BP: 120/72 138/82  Pulse: 88 75  Resp: 16 20  Temp: 100 F (37.8 C) 99.3 F (37.4 C)  SpO2: 94% 98%    General: Afebrile, no chest pain, no shortness of breath, no further episode of nausea or vomiting.  Tolerating diet and has been able to take medication by mouth without any difficulties.  No dysuria at discharge. Cardiovascular: S1 and S2, no rubs, no gallops, no murmurs, no JVD. Respiratory: Clear to auscultation bilaterally; normal respiratory effort. Abdomen: Soft, nontender, nondistended, positive bowel sounds Extremities: No cyanosis, no clubbing, no edema.  Discharge Instructions   Discharge Instructions    Diet - low sodium heart healthy   Complete by: As directed    Diet Carb Modified   Complete by: As directed    Discharge instructions   Complete by: As directed    Take medications as prescribed Maintain adequate hydration Arrange follow-up with PCP in 10 days Complete antibiotic therapy as instructed (make sure to take them around food to minimize GI symptoms).     Allergies as of 12/27/2018      Reactions   Aspirin Nausea And Vomiting, Other (See Comments)   Heart racing      Medication List    TAKE these medications   albuterol 108 (90 Base) MCG/ACT inhaler Commonly known as: VENTOLIN HFA Inhale 2 puffs into the lungs every 6 (six) hours as needed. For shortness of breath.   diphenhydramine-acetaminophen 25-500 MG Tabs tablet Commonly known as: TYLENOL PM Take 1 tablet by mouth at bedtime as needed.   gabapentin 400 MG capsule Commonly known as: NEURONTIN Take 400 mg by  mouth 2 (two) times daily.   levofloxacin 500 MG tablet Commonly known as: LEVAQUIN Take 1 tablet (500 mg total) by mouth daily for 9 days.   lisinopril-hydrochlorothiazide 10-12.5 MG tablet Commonly known as: ZESTORETIC Take 1 tablet by mouth daily.   metFORMIN 500 MG tablet Commonly known as: GLUCOPHAGE Take 250 mg by mouth daily with breakfast.   nicotine 21 mg/24hr patch Commonly known as: NICODERM CQ - dosed in mg/24 hours Place 1 patch (21 mg total) onto the skin daily.   omeprazole 20 MG capsule Commonly known as: PRILOSEC Take 1 capsule (20 mg total) by mouth daily.   traMADol 50 MG tablet Commonly known as: ULTRAM Take 50 mg by mouth every 6 (six) hours as needed.   traZODone 50 MG tablet Commonly known as: DESYREL Take 50 mg by mouth at bedtime.      Allergies  Allergen Reactions  . Aspirin Nausea And Vomiting and Other (See Comments)    Heart racing   Follow-up Information    [facility]. Go on 01/02/2019.   Why: Please arrive at 3:00 for a hospital follow up appointment. Please call by Friday 6/19 if you need to reschedule. Contact information: [place] [address] [phone]           The results of significant diagnostics from this hospitalization (including imaging, microbiology, ancillary and laboratory) are listed below for reference.    Significant Diagnostic Studies: Ct Abdomen Pelvis W Contrast  Result Date: 12/24/2018 CLINICAL DATA:  Abdominal pain.  Suspected diverticulitis. EXAM: CT ABDOMEN AND PELVIS WITH CONTRAST TECHNIQUE: Multidetector CT imaging of the abdomen and pelvis was performed  using the standard protocol following bolus administration of intravenous contrast. CONTRAST:  167m OMNIPAQUE IOHEXOL 300 MG/ML  SOLN COMPARISON:  None. FINDINGS: Lower chest: No acute abnormality. Hepatobiliary: No focal liver abnormality is seen. No gallstones, gallbladder wall thickening, or biliary dilatation. Pancreas:  Unremarkable. No pancreatic ductal dilatation or surrounding inflammatory changes. Spleen: Normal in size without focal abnormality. Adrenals/Urinary Tract: Adrenal glands are normal. 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 No right ureterectasis or ureteral stone noted. No perinephric stranding on the right. There is fat stranding around the left kidney. Additionally, there is heterogeneous enhancement the left kidney with a striated nephrogram appearance. No hydronephrosis. No left ureteral stones are noted. The left ureter is mildly prominent. The bladder is unremarkable. Stomach/Bowel: The stomach and small bowel are normal. The colon and appendix are normal. No diverticulitis identified. Vascular/Lymphatic: Atherosclerotic changes are seen in the nonaneurysmal aorta. A few shotty left periaortic nodes are identified without adenopathy. These nodes are probably reactive. Reproductive: Status post hysterectomy. No adnexal masses. Other: There is a fat containing periumbilical hernia. No free air or suspicious free fluid. Musculoskeletal: No acute or significant osseous findings. IMPRESSION: 1. The left perinephric stranding and striated nephrogram without hydronephrosis is consistent with pyelonephritis. Recommend clinical correlation and correlation with urinalysis. 2. Atherosclerotic change in the nonaneurysmal aorta. Shotty nodes in the left periaortic region are likely reactive. Electronically Signed   By: DDorise BullionIII M.D   On: 12/24/2018 12:10   Dg Chest Port 1 View  Result Date: 12/24/2018 CLINICAL DATA:  Cough, shortness of breath, and fever for 2 days. EXAM: PORTABLE CHEST 1 VIEW COMPARISON:  08/08/2018 FINDINGS: The heart size and mediastinal contours are within normal limits. Both lungs are clear. The visualized skeletal structures are unremarkable. IMPRESSION: No active disease. Electronically Signed   By: JEarle GellM.D.   On: 12/24/2018 09:21     Microbiology: Recent Results (from the past 240 hour(s))  Blood Culture (routine x 2)     Status: Abnormal   Collection Time: 12/24/18  8:20 AM   Specimen: Site Not Specified; Blood  Result Value Ref Range Status   Specimen Description   Final    SITE NOT SPECIFIED Blood Culture adequate volume Performed at [facility] [address], [place] [address]   Special Requests   Final    BOTTLES DRAWN AEROBIC AND ANAEROBIC [facility]formed at [facility] [address], [place] [id number]   Culture  Setup Time   Final    GRAM NEGATIVE RODS Gram Stain Report Called to,Read Back By and Verified With: THOMAS,K. AT 2100 ON 06/13/20520 BY EVA ANAEROBIC BOTTLE ONLY [facility] [address], [place] [address]   Culture (A)  Final    ESCHERICHIA COLI SUSCEPTIBILITIES PERFORMED ON PREVIOUS CULTURE WITHIN THE LAST 5 DAYS. Performed at [facility] [address], [place] [address]   Report Status 12/26/2018 FINAL  Final  SARS Coronavirus 2 (CEPHEID- [facility]formed [facility]), Hosp Order     Status: None   Collection Time: 12/24/18  8:21 AM   Specimen: Nasopharyngeal Swab  Result Value Ref Range Status   SARS Coronavirus 2 NEGATIVE NEGATIVE Final    Comment: (NOTE) If result is NEGATIVE SARS-CoV-2 target nucleic acids are NOT DETECTED. The SARS-CoV-2 RNA is generally detectable in upper and lower  respiratory specimens during the acute  phase of infection. The lowest  concentration of SARS-CoV-2 viral copies this assay can detect is 250  copies / mL. A negative result does not preclude SARS-CoV-2 infection  and should not be used as the sole basis for treatment or other  patient management decisions.  A negative result may occur with  improper specimen collection / handling, submission of specimen other  than nasopharyngeal swab, presence of viral mutation(s) within the  areas targeted  by this assay, and inadequate number of viral copies  (<250 copies / mL). A negative result must be combined with clinical  observations, patient history, and epidemiological information. If result is POSITIVE SARS-CoV-2 target nucleic acids are DETECTED. The SARS-CoV-2 RNA is generally detectable in upper and lower  respiratory specimens dur ing the acute phase of infection.  Positive  results are indicative of active infection with SARS-CoV-2.  Clinical  correlation with patient history and other diagnostic information is  necessary to determine patient infection status.  Positive results do  not rule out bacterial infection or co-infection with other viruses. If result is PRESUMPTIVE POSTIVE SARS-CoV-2 nucleic acids MAY BE PRESENT.   A presumptive positive result was obtained on the submitted specimen  and confirmed on repeat testing.  While 2019 novel coronavirus  (SARS-CoV-2) nucleic acids may be present in the submitted sample  additional confirmatory testing may be necessary for epidemiological  and / or clinical management purposes  to differentiate between  SARS-CoV-2 and other Sarbecovirus currently known to infect humans.  If clinically indicated additional testing with an alternate test  methodology [phone]) is advised. The SARS-CoV-2 RNA is generally  detectable in upper and lower respiratory sp ecimens during the acute  phase of infection. The expected result is Negative. Fact Sheet for Patients:  StrictlyIdeas.no Fact Sheet for Healthcare Providers: BankingDealers.co.za This test is not yet approved or cleared by the [place] FDA and has been authorized for detection and/or diagnosis of SARS-CoV-2 by FDA under an Emergency Use Authorization (EUA).  This EUA will remain in effect (meaning this test can be used) for the duration of the COVID-19 declaration under Section 564(b)(1) of the Act, 21 U.S.C. section  360bbb-3(b)(1), unless the authorization is terminated or revoked sooner. Performed at [facility], [address] [place], [place] [address]   Blood Culture (routine x 2)     Status: Abnormal   Collection Time: 12/24/18  8:33 AM   Specimen: Left Antecubital; Blood  Result Value Ref Range Status   Specimen Description   Final    LEFT ANTECUBITAL Blood Culture adequate volume Performed at [facility], [address], [place], [place] [address]    Special Requests   Final    BOTTLES DRAWN AEROBIC AND ANAEROBIC Performed at [facility], [address] [place], [place] [address]    Culture  Setup Time   Final    GRAM NEGATIVE RODS Gram Stain Report Called to,Read Back By and Verified With: THOMAS,K. AT 2100 ON 12/24/2018 BY EVA IN BOTH AEROBIC AND ANAEROBIC BOTTLES Performed at [facility], READ BACK BY AND VERIFIED WITH: RN S HANDY @ 0234 12/25/18 BY Tobey Bride Performed at [facility], [place] [address] [place], [place] [address]    Culture ESCHERICHIA COLI (A)  Final   Report Status 12/26/2018 FINAL  Final   Organism ID, Bacteria ESCHERICHIA COLI  Final      Susceptibility   Escherichia coli - MIC*    AMPICILLIN >=32 RESISTANT Resistant  CEFAZOLIN <=4 SENSITIVE Sensitive     CEFEPIME <=1 SENSITIVE Sensitive     CEFTAZIDIME <=1 SENSITIVE Sensitive     CEFTRIAXONE <=1 SENSITIVE Sensitive     CIPROFLOXACIN 1 SENSITIVE Sensitive     GENTAMICIN <=1 SENSITIVE Sensitive     IMIPENEM <=0.25 SENSITIVE Sensitive     TRIMETH/SULFA >=320 RESISTANT Resistant     AMPICILLIN/SULBACTAM 16 INTERMEDIATE Intermediate     PIP/TAZO <=4 SENSITIVE Sensitive     Extended ESBL NEGATIVE Sensitive     * ESCHERICHIA COLI  Blood Culture ID Panel (Reflexed)     Status: Abnormal   Collection Time: 12/24/18  8:33 AM  Result Value Ref Range Status   Enterococcus species NOT DETECTED NOT DETECTED Final   Listeria monocytogenes NOT DETECTED NOT DETECTED Final    Staphylococcus species NOT DETECTED NOT DETECTED Final   Staphylococcus aureus (BCID) NOT DETECTED NOT DETECTED Final   Streptococcus species NOT DETECTED NOT DETECTED Final   Streptococcus agalactiae NOT DETECTED NOT DETECTED Final   Streptococcus pneumoniae NOT DETECTED NOT DETECTED Final   Streptococcus pyogenes NOT DETECTED NOT DETECTED Final   Acinetobacter baumannii NOT DETECTED NOT DETECTED Final   Enterobacteriaceae species DETECTED (A) NOT DETECTED Final    Comment: Enterobacteriaceae represent a large family of gram-negative bacteria, not a single organism. CRITICAL RESULT CALLED TO, READ BACK BY AND VERIFIED WITH: RN S HANDY @ 0234 12/25/18 BY S GEZAHEGN    Enterobacter cloacae complex NOT DETECTED NOT DETECTED Final   Escherichia coli DETECTED (A) NOT DETECTED Final    Comment: CRITICAL RESULT CALLED TO, READ BACK BY AND VERIFIED WITH: RN S HANDY @ 0234 12/25/18 BY S GEZAHEGN    Klebsiella oxytoca NOT DETECTED NOT DETECTED Final   Klebsiella pneumoniae NOT DETECTED NOT DETECTED Final   Proteus species NOT DETECTED NOT DETECTED Final   Serratia marcescens NOT DETECTED NOT DETECTED Final   Carbapenem resistance NOT DETECTED NOT DETECTED Final   Haemophilus influenzae NOT DETECTED NOT DETECTED Final   Neisseria meningitidis NOT DETECTED NOT DETECTED Final   Pseudomonas aeruginosa NOT DETECTED NOT DETECTED Final   Candida albicans NOT DETECTED NOT DETECTED Final   Candida glabrata NOT DETECTED NOT DETECTED Final   Candida krusei NOT DETECTED NOT DETECTED Final   Candida parapsilosis NOT DETECTED NOT DETECTED Final   Candida tropicalis NOT DETECTED NOT DETECTED Final    Comment: Performed at [facility], [place], [place], [place] [address]  Urine culture     Status: Abnormal   Collection Time: 12/24/18  9:55 AM   Specimen: Urine, Clean Catch  Result Value Ref Range Status   Specimen Description   Final    URINE, CLEAN CATCH Performed at [facility], [address], [place], [place] [address]    Special Requests   Final    NONE Performed at [facility], [place] [address], [place], [place] [address]    Culture MULTIPLE SPECIES PRESENT, SUGGEST RECOLLECTION (A)  Final   Report Status 12/26/2018 FINAL  Final     Labs: Basic Metabolic Panel: Recent Labs  Lab 12/24/18 0820 12/25/18 0612 12/27/18 0428  NA 139 137 142  K 4.8 3.5 3.9  CL 101 102 110  CO2 '25 23 25  ' GLUCOSE 127* 99 105*  BUN 36* 24* 13  CREATININE 1.73* 1.59* 1.14*  CALCIUM 9.2 7.9* 8.4*   Liver Function Tests: Recent Labs  Lab 12/24/18 0820  AST 15  ALT 25  ALKPHOS 65  BILITOT 0.8  PROT 7.8  ALBUMIN 3.4*   CBC: Recent Labs  Lab 12/24/18 0820 12/25/18 0612 12/27/18 0428  WBC 19.6* 16.4* 9.2  NEUTROABS 13.7*  --   --   HGB 10.9* 9.3* 9.0*  HCT 34.2* 30.0* 28.7*  MCV 84.9 86.0 85.9  PLT 303 237 238    CBG: Recent Labs  Lab 12/26/18 0725 12/26/18 1122 12/26/18 1611 12/26/18 2200 12/27/18 0715  GLUCAP 144* 116* 114* 113* 101*    Signed:  Barton Dubois MD.  [facility] 12/27/2018, 8:43 AM

## 2018-12-27 NOTE — Progress Notes (Signed)
Discharge instructions given on medications and follow up visits,patient verbalized understanding. Prescriptions sent with patient. IV discontinued,catheter intact. Accompanied by staff to an awaiting vehicle.

## 2018-12-28 DIAGNOSIS — A419 Sepsis, unspecified organism: Secondary | ICD-10-CM | POA: Diagnosis not present

## 2018-12-28 DIAGNOSIS — E1142 Type 2 diabetes mellitus with diabetic polyneuropathy: Secondary | ICD-10-CM | POA: Diagnosis not present

## 2018-12-28 DIAGNOSIS — J4530 Mild persistent asthma, uncomplicated: Secondary | ICD-10-CM | POA: Diagnosis not present

## 2018-12-28 DIAGNOSIS — Z0001 Encounter for general adult medical examination with abnormal findings: Secondary | ICD-10-CM | POA: Diagnosis not present

## 2018-12-28 DIAGNOSIS — Z79899 Other long term (current) drug therapy: Secondary | ICD-10-CM | POA: Diagnosis not present

## 2019-01-06 DIAGNOSIS — I10 Essential (primary) hypertension: Secondary | ICD-10-CM | POA: Diagnosis not present

## 2019-01-06 DIAGNOSIS — E785 Hyperlipidemia, unspecified: Secondary | ICD-10-CM | POA: Diagnosis not present

## 2019-01-06 DIAGNOSIS — E1142 Type 2 diabetes mellitus with diabetic polyneuropathy: Secondary | ICD-10-CM | POA: Diagnosis not present

## 2019-01-10 DIAGNOSIS — M79602 Pain in left arm: Secondary | ICD-10-CM | POA: Diagnosis not present

## 2019-01-10 DIAGNOSIS — E1121 Type 2 diabetes mellitus with diabetic nephropathy: Secondary | ICD-10-CM | POA: Diagnosis not present

## 2019-01-10 DIAGNOSIS — N390 Urinary tract infection, site not specified: Secondary | ICD-10-CM | POA: Diagnosis not present

## 2019-01-10 DIAGNOSIS — M25562 Pain in left knee: Secondary | ICD-10-CM | POA: Diagnosis not present

## 2019-01-10 DIAGNOSIS — M25561 Pain in right knee: Secondary | ICD-10-CM | POA: Diagnosis not present

## 2019-01-10 DIAGNOSIS — I129 Hypertensive chronic kidney disease with stage 1 through stage 4 chronic kidney disease, or unspecified chronic kidney disease: Secondary | ICD-10-CM | POA: Diagnosis not present

## 2019-01-23 ENCOUNTER — Ambulatory Visit (INDEPENDENT_AMBULATORY_CARE_PROVIDER_SITE_OTHER): Payer: Medicare Other | Admitting: Orthopedic Surgery

## 2019-01-23 ENCOUNTER — Other Ambulatory Visit: Payer: Self-pay

## 2019-01-23 ENCOUNTER — Ambulatory Visit (INDEPENDENT_AMBULATORY_CARE_PROVIDER_SITE_OTHER): Payer: Medicare Other

## 2019-01-23 ENCOUNTER — Encounter: Payer: Self-pay | Admitting: Orthopedic Surgery

## 2019-01-23 VITALS — BP 99/56 | HR 84 | Temp 97.7°F | Ht 63.0 in | Wt 169.0 lb

## 2019-01-23 DIAGNOSIS — M5441 Lumbago with sciatica, right side: Secondary | ICD-10-CM

## 2019-01-23 DIAGNOSIS — M25562 Pain in left knee: Secondary | ICD-10-CM

## 2019-01-23 DIAGNOSIS — G8929 Other chronic pain: Secondary | ICD-10-CM

## 2019-01-23 DIAGNOSIS — M25561 Pain in right knee: Secondary | ICD-10-CM | POA: Diagnosis not present

## 2019-01-23 DIAGNOSIS — M5440 Lumbago with sciatica, unspecified side: Secondary | ICD-10-CM | POA: Diagnosis not present

## 2019-01-23 DIAGNOSIS — M5442 Lumbago with sciatica, left side: Secondary | ICD-10-CM | POA: Diagnosis not present

## 2019-01-23 MED ORDER — MELOXICAM 7.5 MG PO TABS: 7.5000 mg | ORAL_TABLET | Freq: Every day | ORAL | 5 refills | Status: DC

## 2019-01-23 MED ORDER — TIZANIDINE HCL 4 MG PO TABS: 4.0000 mg | ORAL_TABLET | Freq: Four times a day (QID) | ORAL | 0 refills | Status: DC | PRN

## 2019-01-23 NOTE — Addendum Note (Signed)
Addended byCandice Camp on: 01/23/2019 09:57 AM   Modules accepted: Orders

## 2019-01-23 NOTE — Patient Instructions (Signed)

## 2019-01-23 NOTE — Progress Notes (Signed)
Barbara Wilcox  01/23/2019  HISTORY SECTION :  Chief Complaint  Patient presents with  . Knee Pain    Bilat knee pain   68 year old female came in with bilateral knee pain as a complaint but actually complains of bilateral leg pain lower back pain and left shoulder pain  She says she has a history of pinched nerves in her back from a long time ago presents with lower back pain bilateral leg pain occasionally radiating down into her foot.  Pain is relieved by hydrocodone unrelieved by tramadol.  No physical therapy has been done.  Pain quality is dull ache  Patient reports severe pain     Review of Systems  Musculoskeletal: Positive for back pain.       Intermittent lower back pain with x-rays going back to 2013 pain worse over the last 3 weeks since she got out of the hospital     Past Medical History:  Diagnosis Date  . Acid reflux   . Arthritis   . Asthma   . Bronchitis   . Bursitis   . Diabetes mellitus with retinopathy ([place])   . Hypertension     Past Surgical History:  Procedure Laterality Date  . ABDOMINAL HYSTERECTOMY    . TOOTH EXTRACTION       Allergies  Allergen Reactions  . Aspirin Nausea And Vomiting and Other (See Comments)    Heart racing     Current Outpatient Medications:  .  albuterol (PROVENTIL HFA;VENTOLIN HFA) 108 (90 Base) MCG/ACT inhaler, Inhale 2 puffs into the lungs every 6 (six) hours as needed. For shortness of breath., Disp: 1 Inhaler, Rfl: 1 .  citalopram (CELEXA) 40 MG tablet, TAKE 1 TABLET BY MOUTH ONCE A DAY FOR MOOD, Disp: , Rfl:  .  diphenhydramine-acetaminophen (TYLENOL PM) 25-500 MG TABS tablet, Take 1 tablet by mouth at bedtime as needed., Disp: , Rfl:  .  gabapentin (NEURONTIN) 400 MG capsule, Take 400 mg by mouth 2 (two) times daily. , Disp: , Rfl:  .  ibuprofen (ADVIL) 800 MG tablet, TAKE 1 TABLET BY MOUTH 3 TIMES A DAY AS NEEDED FOR PAIN, Disp: , Rfl:  .  lisinopril-hydrochlorothiazide (PRINZIDE,ZESTORETIC) 10-12.5  MG tablet, Take 1 tablet by mouth daily., Disp: , Rfl: 1 .  loratadine (CLARITIN) 10 MG tablet, TAKE 1 TABLET BY MOUTH ONCE A DAY FOR ALLERGIES, Disp: , Rfl:  .  metFORMIN (GLUCOPHAGE) 500 MG tablet, Take 250 mg by mouth daily with breakfast., Disp: , Rfl:  .  montelukast (SINGULAIR) 10 MG tablet, TAKE 1 ORAL TABLET ONCE A DAY FOR ALLERGIES, Disp: , Rfl:  .  nicotine (NICODERM CQ - DOSED IN MG/24 HOURS) 21 mg/24hr patch, Place 1 patch (21 mg total) onto the skin daily., Disp: 28 patch, Rfl: 1 .  nitrofurantoin, macrocrystal-monohydrate, (MACROBID) 100 MG capsule, TAKE 1 CAPSULE 2 TIMES A DAY FOR UTI, Disp: , Rfl:  .  pantoprazole (PROTONIX) 40 MG tablet, TAKE 1 ORAL TABLET ONCE A DAY FOR GERD., Disp: , Rfl:  .  pravastatin (PRAVACHOL) 10 MG tablet, TAKE 1 TABLET BY MOUTH EVERYDAY AT BEDTIME, Disp: , Rfl:  .  traMADol (ULTRAM) 50 MG tablet, Take 50 mg by mouth every 6 (six) hours as needed., Disp: , Rfl:  .  traZODone (DESYREL) 50 MG tablet, Take 50 mg by mouth at bedtime., Disp: , Rfl:    PHYSICAL EXAM SECTION: 1) BP (!) 99/56   Pulse 84   Temp 97.7 F (36.5 C)   Ht  5\' 3"  (1.6 m)   Wt 169 lb (76.7 kg)   BMI 29.94 kg/m   Body mass index is 29.94 kg/m. General appearance: Well-developed well-nourished no gross deformities  2) Cardiovascular normal pulse and perfusion in all 4 extremities normal color without edema  3) Neurologically deep tendon reflexes are equal and normal, no sensation loss or deficits no pathologic reflexes  4) Psychological: Awake alert and oriented x3 mood and affect normal  5) Skin no lacerations or ulcerations no nodularity no palpable masses, no erythema or nodularity  6) Musculoskeletal:   Right and left knee exams show normal alignment without effusion full range of motion with no palpable tenderness strength and muscle tone normal McMurray sign negative all ligaments were stable  Patient's primary pain was in her lower back and involved the midline left  and right side with tenderness to palpation pain when lying down from flexion to extension     MEDICAL DECISION SECTION:  Encounter Diagnoses  Name Primary?  . Chronic pain of left knee Yes  . Chronic pain of right knee   . Chronic bilateral low back pain with bilateral sciatica     Imaging Plain films of her knees are normal, see my report  An old back x-ray report is available FINDINGS CLINICAL DATA:  LOW BACK PAIN SINCE A FALL ONE WEEK AGO LUMBAR SPINE COMPLETE: FIVE VIEWS DEMONSTRATE FIVE NON-RIB-BEARING LUMBAR VERTEBRAE.  MILD ANTERIOR SPUR FORMATION IS NOTED AT THE L2-3 AND L3-4 LEVELS.  NO FRACTURES OR SUBLUXATIONS ARE SEEN. IMPRESSION MILD ANTERIOR SPONDYLOSIS AT THE L2-3 AND L3-4 LEVELS.  OTHERWISE, UNREMARKABLE EXAMINATION.  Lumbar spine see report Today the x-ray show abnormal contour of the spine in the coronal plane with some mild arthritis in the facet joints of L4-S1  Encounter Diagnoses  Name Primary?  . Chronic pain of left knee Yes  . Chronic pain of right knee   . Chronic bilateral low back pain with sciatica, sciatica laterality unspecified       Plan:  (Rx., Inj., surg., Frx, MRI/CT, XR:2) Referral for pain management PT referral Medications Meds ordered this encounter  Medications  . tiZANidine (ZANAFLEX) 4 MG tablet    Sig: Take 1 tablet (4 mg total) by mouth every 6 (six) hours as needed for muscle spasms.    Dispense:  30 tablet    Refill:  0  . meloxicam (MOBIC) 7.5 MG tablet    Sig: Take 1 tablet (7.5 mg total) by mouth daily.    Dispense:  30 tablet    Refill:  5     Fu 1 week for xrays right shoulder, new problem   9:05 AM

## 2019-01-24 DIAGNOSIS — M545 Low back pain: Secondary | ICD-10-CM | POA: Diagnosis not present

## 2019-01-24 DIAGNOSIS — M25561 Pain in right knee: Secondary | ICD-10-CM | POA: Diagnosis not present

## 2019-01-24 DIAGNOSIS — M25562 Pain in left knee: Secondary | ICD-10-CM | POA: Diagnosis not present

## 2019-01-24 DIAGNOSIS — M542 Cervicalgia: Secondary | ICD-10-CM | POA: Diagnosis not present

## 2019-01-24 DIAGNOSIS — R262 Difficulty in walking, not elsewhere classified: Secondary | ICD-10-CM | POA: Diagnosis not present

## 2019-01-27 DIAGNOSIS — M545 Low back pain: Secondary | ICD-10-CM | POA: Diagnosis not present

## 2019-01-27 DIAGNOSIS — M25562 Pain in left knee: Secondary | ICD-10-CM | POA: Diagnosis not present

## 2019-01-27 DIAGNOSIS — R262 Difficulty in walking, not elsewhere classified: Secondary | ICD-10-CM | POA: Diagnosis not present

## 2019-01-27 DIAGNOSIS — M25561 Pain in right knee: Secondary | ICD-10-CM | POA: Diagnosis not present

## 2019-01-27 DIAGNOSIS — M542 Cervicalgia: Secondary | ICD-10-CM | POA: Diagnosis not present

## 2019-01-30 ENCOUNTER — Other Ambulatory Visit: Payer: Self-pay

## 2019-01-30 ENCOUNTER — Ambulatory Visit (INDEPENDENT_AMBULATORY_CARE_PROVIDER_SITE_OTHER): Payer: Medicare Other | Admitting: Orthopedic Surgery

## 2019-01-30 ENCOUNTER — Ambulatory Visit (INDEPENDENT_AMBULATORY_CARE_PROVIDER_SITE_OTHER): Payer: Medicare Other

## 2019-01-30 VITALS — BP 103/67 | HR 77 | Temp 98.3°F | Ht 63.0 in | Wt 169.0 lb

## 2019-01-30 DIAGNOSIS — G8929 Other chronic pain: Secondary | ICD-10-CM

## 2019-01-30 DIAGNOSIS — N390 Urinary tract infection, site not specified: Secondary | ICD-10-CM | POA: Diagnosis not present

## 2019-01-30 DIAGNOSIS — M25512 Pain in left shoulder: Secondary | ICD-10-CM

## 2019-01-30 DIAGNOSIS — E119 Type 2 diabetes mellitus without complications: Secondary | ICD-10-CM | POA: Diagnosis not present

## 2019-01-30 MED ORDER — ACETAMINOPHEN-CODEINE #3 300-30 MG PO TABS: 1.0000 | ORAL_TABLET | Freq: Four times a day (QID) | ORAL | 0 refills | Status: DC | PRN

## 2019-01-30 NOTE — Patient Instructions (Addendum)
For pain: Use a heating pad Tizanidine Ibuprofen  Shoulder Pain Many things can cause shoulder pain, including:  An injury to the shoulder.  Overuse of the shoulder.  Arthritis. The source of the pain can be:  Inflammation.  An injury to the shoulder joint.  An injury to a tendon, ligament, or bone. Follow these instructions at home: Pay attention to changes in your symptoms. Let your health care provider know about them. Follow these instructions to relieve your pain. If you have a sling:  Wear the sling as told by your health care provider. Remove it only as told by your health care provider.  Loosen the sling if your fingers tingle, become numb, or turn cold and blue.  Keep the sling clean.  If the sling is not waterproof: ? Do not let it get wet. Remove it to shower or bathe.  Move your arm as little as possible, but keep your hand moving to prevent swelling. Managing pain, stiffness, and swelling   If directed, put ice on the painful area: ? Put ice in a plastic bag. ? Place a towel between your skin and the bag. ? Leave the ice on for 20 minutes, 2-3 times per day. Stop applying ice if it does not help with the pain.  Squeeze a soft ball or a foam pad as much as possible. This helps to keep the shoulder from swelling. It also helps to strengthen the arm. General instructions  Take over-the-counter and prescription medicines only as told by your health care provider.  Keep all follow-up visits as told by your health care provider. This is important. Contact a health care provider if:  Your pain gets worse.  Your pain is not relieved with medicines.  New pain develops in your arm, hand, or fingers. Get help right away if:  Your arm, hand, or fingers: ? Tingle. ? Become numb. ? Become swollen. ? Become painful. ? Turn white or blue. Summary  Shoulder pain can be caused by an injury, overuse, or arthritis.  Pay attention to changes in your  symptoms. Let your health care provider know about them.  This condition may be treated with a sling, ice, and pain medicines.  Contact your health care provider if the pain gets worse or new pain develops. Get help right away if your arm, hand, or fingers tingle or become numb, swollen, or painful.  Keep all follow-up visits as told by your health care provider. This is important. This information is not intended to replace advice given to you by your health care provider. Make sure you discuss any questions you have with your health care provider. Document Released: 04/08/2005 Document Revised: 01/11/2018 Document Reviewed: 01/11/2018 Elsevier Patient Education  2020 [employer].

## 2019-01-30 NOTE — Progress Notes (Signed)
Barbara Wilcox  01/30/2019  HISTORY SECTION :  Chief Complaint  Patient presents with  . Shoulder Pain    Left shoulder for years most current episode for 3 weeks    68 year old female chronic left shoulder pain was told she had bursitis comes in complaining of pain around the left shoulder joint with a clicking sensation.  She describes several months of pain with dull aching sensation moderate severity decreased range of motion mild weakness  Review of Systems  Cardiovascular: Positive for orthopnea.  All other systems reviewed and are negative.    Past Medical History:  Diagnosis Date  . Acid reflux   . Arthritis   . Asthma   . Bronchitis   . Bursitis   . Diabetes mellitus with retinopathy ([place])   . Hypertension     Past Surgical History:  Procedure Laterality Date  . ABDOMINAL HYSTERECTOMY    . TOOTH EXTRACTION       Allergies  Allergen Reactions  . Aspirin Nausea And Vomiting and Other (See Comments)    Heart racing     Current Outpatient Medications:  .  albuterol (PROVENTIL HFA;VENTOLIN HFA) 108 (90 Base) MCG/ACT inhaler, Inhale 2 puffs into the lungs every 6 (six) hours as needed. For shortness of breath., Disp: 1 Inhaler, Rfl: 1 .  citalopram (CELEXA) 40 MG tablet, TAKE 1 TABLET BY MOUTH ONCE A DAY FOR MOOD, Disp: , Rfl:  .  diphenhydramine-acetaminophen (TYLENOL PM) 25-500 MG TABS tablet, Take 1 tablet by mouth at bedtime as needed., Disp: , Rfl:  .  gabapentin (NEURONTIN) 400 MG capsule, Take 400 mg by mouth 2 (two) times daily. , Disp: , Rfl:  .  ibuprofen (ADVIL) 800 MG tablet, TAKE 1 TABLET BY MOUTH 3 TIMES A DAY AS NEEDED FOR PAIN, Disp: , Rfl:  .  lisinopril-hydrochlorothiazide (PRINZIDE,ZESTORETIC) 10-12.5 MG tablet, Take 1 tablet by mouth daily., Disp: , Rfl: 1 .  loratadine (CLARITIN) 10 MG tablet, TAKE 1 TABLET BY MOUTH ONCE A DAY FOR ALLERGIES, Disp: , Rfl:  .  meloxicam (MOBIC) 7.5 MG tablet, Take 1 tablet (7.5 mg total) by mouth daily.,  Disp: 30 tablet, Rfl: 5 .  metFORMIN (GLUCOPHAGE) 500 MG tablet, Take 250 mg by mouth daily with breakfast., Disp: , Rfl:  .  montelukast (SINGULAIR) 10 MG tablet, TAKE 1 ORAL TABLET ONCE A DAY FOR ALLERGIES, Disp: , Rfl:  .  nicotine (NICODERM CQ - DOSED IN MG/24 HOURS) 21 mg/24hr patch, Place 1 patch (21 mg total) onto the skin daily., Disp: 28 patch, Rfl: 1 .  nitrofurantoin, macrocrystal-monohydrate, (MACROBID) 100 MG capsule, TAKE 1 CAPSULE 2 TIMES A DAY FOR UTI, Disp: , Rfl:  .  pantoprazole (PROTONIX) 40 MG tablet, TAKE 1 ORAL TABLET ONCE A DAY FOR GERD., Disp: , Rfl:  .  pravastatin (PRAVACHOL) 10 MG tablet, TAKE 1 TABLET BY MOUTH EVERYDAY AT BEDTIME, Disp: , Rfl:  .  tiZANidine (ZANAFLEX) 4 MG tablet, Take 1 tablet (4 mg total) by mouth every 6 (six) hours as needed for muscle spasms., Disp: 30 tablet, Rfl: 0 .  traMADol (ULTRAM) 50 MG tablet, Take 50 mg by mouth every 6 (six) hours as needed., Disp: , Rfl:  .  traZODone (DESYREL) 50 MG tablet, Take 50 mg by mouth at bedtime., Disp: , Rfl:    PHYSICAL EXAM SECTION: 1) BP 103/67   Pulse 77   Temp 98.3 F (36.8 C)   Ht 5\' 3"  (1.6 m)   Wt 169 lb (76.7  kg)   BMI 29.94 kg/m   Body mass index is 29.94 kg/m. General appearance: Well-developed well-nourished no gross deformities  2) Cardiovascular normal pulse and perfusion in the upper extremities normal color without edema  3) Neurologically deep tendon reflexes are equal and normal, no sensation loss or deficits no pathologic reflexes  4) Psychological: Awake alert and oriented x3 mood and affect normal  5) Skin no lacerations or ulcerations no nodularity no palpable masses, no erythema or nodularity  6) Musculoskeletal:   Right shoulder mild tenderness no loss of motion no weakness  Left shoulder Tenderness anterior laterally no deformity Passive range of motion is normal she has pain with passive range of motion past 90 degrees no external rotation deficit No  instability Normal motor function Skin normal  MEDICAL DECISION SECTION:  Encounter Diagnosis  Name Primary?  . Chronic left shoulder pain Yes    Imaging Left shoulder 3 views see the report, no major abnormalities   Plan:  (Rx., Inj., surg., Frx, MRI/CT, XR:2) Continue tizanidine ibuprofen heating pad for her back  Inject shoulder  Procedure note the subacromial injection shoulder left   Verbal consent was obtained to inject the  Left   Shoulder  Timeout was completed to confirm the injection site is a subacromial space of the  left  shoulder  Medication used Depo-Medrol 40 mg and lidocaine 1% 3 cc  Anesthesia was provided by ethyl chloride  The injection was performed in the left  posterior subacromial space. After pinning the skin with alcohol and anesthetized the skin with ethyl chloride the subacromial space was injected using a 20-gauge needle. There were no complications  Sterile dressing was applied.   Home exercise program  Shoulder follow-up PRN  9:57 AM  Meds ordered this encounter  Medications  . DISCONTD: acetaminophen-codeine (TYLENOL #3) 300-30 MG tablet    Sig: Take 1 tablet by mouth every 6 (six) hours as needed for moderate pain.    Dispense:  30 tablet    Refill:  0  . acetaminophen-codeine (TYLENOL #3) 300-30 MG tablet    Sig: Take 1 tablet by mouth every 6 (six) hours as needed for moderate pain.    Dispense:  30 tablet    Refill:  0

## 2019-02-08 ENCOUNTER — Other Ambulatory Visit: Payer: Self-pay | Admitting: Orthopedic Surgery

## 2019-02-08 DIAGNOSIS — G8929 Other chronic pain: Secondary | ICD-10-CM

## 2019-02-28 ENCOUNTER — Other Ambulatory Visit: Payer: Self-pay | Admitting: Orthopedic Surgery

## 2019-02-28 DIAGNOSIS — M5440 Lumbago with sciatica, unspecified side: Secondary | ICD-10-CM

## 2019-02-28 DIAGNOSIS — G8929 Other chronic pain: Secondary | ICD-10-CM

## 2019-03-15 ENCOUNTER — Ambulatory Visit: Payer: Medicare Other | Admitting: Orthopedic Surgery

## 2019-04-10 ENCOUNTER — Encounter: Payer: Self-pay | Admitting: Orthopedic Surgery

## 2019-04-10 ENCOUNTER — Other Ambulatory Visit: Payer: Self-pay | Admitting: Radiology

## 2019-04-10 ENCOUNTER — Ambulatory Visit (INDEPENDENT_AMBULATORY_CARE_PROVIDER_SITE_OTHER): Payer: Medicare Other | Admitting: Orthopedic Surgery

## 2019-04-10 ENCOUNTER — Other Ambulatory Visit: Payer: Self-pay

## 2019-04-10 VITALS — BP 94/79 | HR 79 | Ht 63.0 in | Wt 169.0 lb

## 2019-04-10 DIAGNOSIS — R05 Cough: Secondary | ICD-10-CM | POA: Diagnosis not present

## 2019-04-10 DIAGNOSIS — I10 Essential (primary) hypertension: Secondary | ICD-10-CM | POA: Diagnosis not present

## 2019-04-10 DIAGNOSIS — M25512 Pain in left shoulder: Secondary | ICD-10-CM | POA: Diagnosis not present

## 2019-04-10 DIAGNOSIS — J0100 Acute maxillary sinusitis, unspecified: Secondary | ICD-10-CM | POA: Diagnosis not present

## 2019-04-10 DIAGNOSIS — E1142 Type 2 diabetes mellitus with diabetic polyneuropathy: Secondary | ICD-10-CM | POA: Diagnosis not present

## 2019-04-10 DIAGNOSIS — Z1159 Encounter for screening for other viral diseases: Secondary | ICD-10-CM | POA: Diagnosis not present

## 2019-04-10 DIAGNOSIS — G8929 Other chronic pain: Secondary | ICD-10-CM | POA: Diagnosis not present

## 2019-04-10 DIAGNOSIS — M5440 Lumbago with sciatica, unspecified side: Secondary | ICD-10-CM

## 2019-04-10 DIAGNOSIS — J4540 Moderate persistent asthma, uncomplicated: Secondary | ICD-10-CM | POA: Diagnosis not present

## 2019-04-10 DIAGNOSIS — E785 Hyperlipidemia, unspecified: Secondary | ICD-10-CM | POA: Diagnosis not present

## 2019-04-10 MED ORDER — TIZANIDINE HCL 4 MG PO TABS: 4.0000 mg | ORAL_TABLET | Freq: Four times a day (QID) | ORAL | 0 refills | Status: DC | PRN

## 2019-04-10 MED ORDER — METHYLPREDNISOLONE ACETATE 40 MG/ML IJ SUSP
40.0000 mg | Freq: Once | INTRAMUSCULAR | Status: AC
  Administered 2019-04-10: 40 mg via INTRAMUSCULAR

## 2019-04-10 NOTE — Patient Instructions (Signed)
Please resume physical therapy  You have received an injection of steroids into the joint. 15% of patients will have increased pain within the 24 hours postinjection.   This is transient and will go away.   We recommend that you use ice packs on the injection site for 20 minutes every 2 hours and extra strength Tylenol 2 tablets every 8 as needed until the pain resolves.  If you continue to have pain after taking the Tylenol and using the ice please call the office for further instructions.

## 2019-04-10 NOTE — Progress Notes (Signed)
Barbara Wilcox  04/10/2019  HISTORY SECTION :  Chief Complaint  Patient presents with  . Shoulder Pain    left / did not go for therapy, has taken care of sister in dialysis    HPI The patient presents for evaluation of left shoulder and left lower back.  Patient was scheduled for therapy could not go because she is taking care of her sister    Location left shoulder chronic pain dull ache with decreased range of motion at that time is worse with activity  Left lower back pain does not appear to radiate intermittent present for about 2 months dull ache  Review of Systems  Constitutional: Negative for fever.  Gastrointestinal: Negative.   Genitourinary: Negative.   Musculoskeletal: Positive for back pain and joint pain.  Neurological: Negative for tingling.     has a past medical history of Acid reflux, Arthritis, Asthma, Bronchitis, Bursitis, Diabetes mellitus with retinopathy ([place]), and Hypertension.   Past Surgical History:  Procedure Laterality Date  . ABDOMINAL HYSTERECTOMY    . TOOTH EXTRACTION      Body mass index is 29.94 kg/m.   Allergies  Allergen Reactions  . Aspirin Nausea And Vomiting and Other (See Comments)    Heart racing     Current Outpatient Medications:  .  acetaminophen-codeine (TYLENOL #3) 300-30 MG tablet, Take 1 tablet by mouth every 6 (six) hours as needed for moderate pain., Disp: 30 tablet, Rfl: 0 .  albuterol (PROVENTIL HFA;VENTOLIN HFA) 108 (90 Base) MCG/ACT inhaler, Inhale 2 puffs into the lungs every 6 (six) hours as needed. For shortness of breath., Disp: 1 Inhaler, Rfl: 1 .  citalopram (CELEXA) 40 MG tablet, TAKE 1 TABLET BY MOUTH ONCE A DAY FOR MOOD, Disp: , Rfl:  .  diphenhydramine-acetaminophen (TYLENOL PM) 25-500 MG TABS tablet, Take 1 tablet by mouth at bedtime as needed., Disp: , Rfl:  .  gabapentin (NEURONTIN) 400 MG capsule, Take 400 mg by mouth 2 (two) times daily. , Disp: , Rfl:  .  ibuprofen (ADVIL) 800 MG tablet, TAKE  1 TABLET BY MOUTH 3 TIMES A DAY AS NEEDED FOR PAIN, Disp: , Rfl:  .  lisinopril-hydrochlorothiazide (PRINZIDE,ZESTORETIC) 10-12.5 MG tablet, Take 1 tablet by mouth daily., Disp: , Rfl: 1 .  loratadine (CLARITIN) 10 MG tablet, TAKE 1 TABLET BY MOUTH ONCE A DAY FOR ALLERGIES, Disp: , Rfl:  .  meloxicam (MOBIC) 7.5 MG tablet, Take 1 tablet (7.5 mg total) by mouth daily., Disp: 30 tablet, Rfl: 5 .  metFORMIN (GLUCOPHAGE) 500 MG tablet, Take 250 mg by mouth daily with breakfast., Disp: , Rfl:  .  montelukast (SINGULAIR) 10 MG tablet, TAKE 1 ORAL TABLET ONCE A DAY FOR ALLERGIES, Disp: , Rfl:  .  nicotine (NICODERM CQ - DOSED IN MG/24 HOURS) 21 mg/24hr patch, Place 1 patch (21 mg total) onto the skin daily., Disp: 28 patch, Rfl: 1 .  nitrofurantoin, macrocrystal-monohydrate, (MACROBID) 100 MG capsule, TAKE 1 CAPSULE 2 TIMES A DAY FOR UTI, Disp: , Rfl:  .  pantoprazole (PROTONIX) 40 MG tablet, TAKE 1 ORAL TABLET ONCE A DAY FOR GERD., Disp: , Rfl:  .  pravastatin (PRAVACHOL) 10 MG tablet, TAKE 1 TABLET BY MOUTH EVERYDAY AT BEDTIME, Disp: , Rfl:  .  tiZANidine (ZANAFLEX) 4 MG tablet, TAKE 1 TABLET (4 MG TOTAL) BY MOUTH EVERY 6 (SIX) HOURS AS NEEDED FOR MUSCLE SPASMS., Disp: 30 tablet, Rfl: 0 .  traMADol (ULTRAM) 50 MG tablet, Take 50 mg by mouth every 6 (  six) hours as needed., Disp: , Rfl:  .  traZODone (DESYREL) 50 MG tablet, Take 50 mg by mouth at bedtime., Disp: , Rfl:    PHYSICAL EXAM SECTION: 1) BP 94/79   Pulse 79   Ht 5\' 3"  (1.6 m)   Wt 169 lb (76.7 kg)   BMI 29.94 kg/m   Body mass index is 29.94 kg/m. General appearance: Well-developed well-nourished no gross deformities  2) Cardiovascular normal pulse and perfusion in the upper extremities normal color without edema  3) Neurologically deep tendon reflexes are equal and normal, no sensation loss or deficits no pathologic reflexes  4) Psychological: Awake alert and oriented x3 mood and affect normal  5) Skin no lacerations or ulcerations  no nodularity no palpable masses, no erythema or nodularity  6) Musculoskeletal:  Left shoulder she has forward active elevation 150 degrees with pain Peri-acromial tenderness especially posteriorly mild laterally Mild weakness rotator cuff supraspinatus normal internal and external rotation strength no instability  Mild tenderness lower back left gluteal area negative straight leg raises   MEDICAL DECISION SECTION:  Encounter Diagnoses  Name Primary?  . Chronic bilateral low back pain with sciatica, sciatica laterality unspecified Yes  . Chronic pain in left shoulder     Imaging No new imaging  Plan:  (Rx., Inj., surg., Frx, MRI/CT, XR:2)  Resume PT  Inject shoulder Inject hip FU prn   Procedure note the subacromial injection shoulder left   Verbal consent was obtained to inject the  Left   Shoulder  Timeout was completed to confirm the injection site is a subacromial space of the  left  shoulder  Medication used Depo-Medrol 40 mg and lidocaine 1% 3 cc  Anesthesia was provided by ethyl chloride  The injection was performed in the left  posterior subacromial space. After pinning the skin with alcohol and anesthetized the skin with ethyl chloride the subacromial space was injected using a 20-gauge needle. There were no complications  Sterile dressing was applied.  IM injection left hip by nurse verbal consent obtained Timeout completed 40 mg of Depo-Medrol with 2 cc lidocaine injected left hip  10:36 AM Arther Abbott, MD  04/10/2019

## 2019-04-10 NOTE — Addendum Note (Signed)
Addended byCandice Camp on: 04/10/2019 11:01 AM   Modules accepted: Orders

## 2019-04-10 NOTE — Telephone Encounter (Signed)
Patient called, said she was supposed to get a refill of muscle relaxers sent in and did not.  Please call her to discuss.

## 2019-04-11 NOTE — Telephone Encounter (Signed)
He sent in yesterday, I called her to advise.

## 2019-04-19 DIAGNOSIS — I129 Hypertensive chronic kidney disease with stage 1 through stage 4 chronic kidney disease, or unspecified chronic kidney disease: Secondary | ICD-10-CM | POA: Diagnosis not present

## 2019-04-19 DIAGNOSIS — N183 Chronic kidney disease, stage 3 unspecified: Secondary | ICD-10-CM | POA: Diagnosis not present

## 2019-04-19 DIAGNOSIS — Z79891 Long term (current) use of opiate analgesic: Secondary | ICD-10-CM | POA: Diagnosis not present

## 2019-04-19 DIAGNOSIS — Z79899 Other long term (current) drug therapy: Secondary | ICD-10-CM | POA: Diagnosis not present

## 2019-04-19 DIAGNOSIS — E785 Hyperlipidemia, unspecified: Secondary | ICD-10-CM | POA: Diagnosis not present

## 2019-05-02 ENCOUNTER — Other Ambulatory Visit: Payer: Self-pay

## 2019-05-02 ENCOUNTER — Other Ambulatory Visit: Payer: Self-pay | Admitting: Orthopedic Surgery

## 2019-05-02 DIAGNOSIS — G8929 Other chronic pain: Secondary | ICD-10-CM

## 2019-05-02 DIAGNOSIS — Z20822 Contact with and (suspected) exposure to covid-19: Secondary | ICD-10-CM

## 2019-05-03 LAB — NOVEL CORONAVIRUS, NAA: SARS-CoV-2, NAA: NOT DETECTED

## 2019-05-05 ENCOUNTER — Telehealth: Payer: Self-pay | Admitting: Family Medicine

## 2019-05-05 NOTE — Telephone Encounter (Signed)
Patient called in and received her covid test result  °

## 2019-05-10 ENCOUNTER — Telehealth: Payer: Self-pay | Admitting: General Practice

## 2019-05-10 NOTE — Telephone Encounter (Signed)
Negative COVID results given. Patient results "NOT Detected." Caller expressed understanding. ° °

## 2019-06-04 ENCOUNTER — Other Ambulatory Visit: Payer: Self-pay | Admitting: Orthopedic Surgery

## 2019-06-04 DIAGNOSIS — M5440 Lumbago with sciatica, unspecified side: Secondary | ICD-10-CM

## 2019-06-04 DIAGNOSIS — G8929 Other chronic pain: Secondary | ICD-10-CM

## 2019-06-16 ENCOUNTER — Other Ambulatory Visit: Payer: Self-pay

## 2019-06-16 DIAGNOSIS — Z20822 Contact with and (suspected) exposure to covid-19: Secondary | ICD-10-CM

## 2019-06-18 LAB — NOVEL CORONAVIRUS, NAA: SARS-CoV-2, NAA: NOT DETECTED

## 2019-06-19 ENCOUNTER — Encounter: Payer: Self-pay | Admitting: Orthopedic Surgery

## 2019-06-19 ENCOUNTER — Other Ambulatory Visit: Payer: Self-pay

## 2019-06-19 ENCOUNTER — Ambulatory Visit (INDEPENDENT_AMBULATORY_CARE_PROVIDER_SITE_OTHER): Payer: Medicare Other | Admitting: Orthopedic Surgery

## 2019-06-19 ENCOUNTER — Encounter

## 2019-06-19 DIAGNOSIS — M25512 Pain in left shoulder: Secondary | ICD-10-CM | POA: Diagnosis not present

## 2019-06-19 DIAGNOSIS — M5440 Lumbago with sciatica, unspecified side: Secondary | ICD-10-CM

## 2019-06-19 DIAGNOSIS — M545 Low back pain: Secondary | ICD-10-CM | POA: Diagnosis not present

## 2019-06-19 DIAGNOSIS — Z23 Encounter for immunization: Secondary | ICD-10-CM | POA: Diagnosis not present

## 2019-06-19 DIAGNOSIS — G8929 Other chronic pain: Secondary | ICD-10-CM

## 2019-06-19 MED ORDER — ACETAMINOPHEN-CODEINE #3 300-30 MG PO TABS: 1.0000 | ORAL_TABLET | Freq: Four times a day (QID) | ORAL | 0 refills | Status: AC | PRN

## 2019-06-19 MED ORDER — TIZANIDINE HCL 4 MG PO TABS: 4.0000 mg | ORAL_TABLET | Freq: Four times a day (QID) | ORAL | 0 refills | Status: DC | PRN

## 2019-06-19 MED ORDER — METHYLPREDNISOLONE ACETATE 40 MG/ML IJ SUSP
40.0000 mg | Freq: Once | INTRAMUSCULAR | Status: AC
  Administered 2019-06-19: 40 mg via INTRAMUSCULAR

## 2019-06-19 NOTE — Patient Instructions (Signed)
Problem #1 rotator cuff tendinitis and bursitis left shoulder  Problem #2 chronic lower back pain  Problem #3 chronic pain  You have received an injection in the left shoulder and an intramuscular shot in your left hip.  Your primary problem is coming from the lower back causing pain radiating to your knee and hip  You will be scheduled to see a chronic pain manager to manage her chronic pain and prescribe your opioid medications which include Tylenol with codeine.  I can no longer prescribe that medicine per [facility] and federal guidelines.  You will have an MRI done of your back I will call you with your results and schedule you to see a back specialist for further care of your back and hip pain  Do not take ibuprofen  Continue tizanidine and ibuprofen.  Stop taking tramadol

## 2019-06-19 NOTE — Progress Notes (Signed)
Barbara Wilcox  06/19/2019  Body mass index is 29.58 kg/m.   HISTORY SECTION :  Chief Complaint  Patient presents with  . Shoulder Pain    Recheck on left shoulder and left hip.  . Hip Pain   HPI Barbara Wilcox is a 68 year old female has left shoulder pain and left lower back pain radiating to the left hip knees have been x-rayed and show no arthritis presumably has chronic lower back pain from degenerative disc disease and then has bursitis in the left shoulder as her rotator cuff on clinical exam seems to be intact  She presents today with continuing complaints of left lower back pain radiating to the left thigh and knee but not below she also has painful left shoulder with active activities and range of motion above her head  Review of systems below  ROS   We note no fever no GI problems no GU problems no numbness or tingling   has a past medical history of Acid reflux, Arthritis, Asthma, Bronchitis, Bursitis, Diabetes mellitus with retinopathy ([place]), and Hypertension.   Past Surgical History:  Procedure Laterality Date  . ABDOMINAL HYSTERECTOMY    . TOOTH EXTRACTION      Body mass index is 29.58 kg/m.   Allergies  Allergen Reactions  . Aspirin Nausea And Vomiting and Other (See Comments)    Heart racing     Current Outpatient Medications:  .  acetaminophen-codeine (TYLENOL #3) 300-30 MG tablet, Take 1 tablet by mouth every 6 (six) hours as needed for moderate pain., Disp: 30 tablet, Rfl: 0 .  albuterol (PROVENTIL HFA;VENTOLIN HFA) 108 (90 Base) MCG/ACT inhaler, Inhale 2 puffs into the lungs every 6 (six) hours as needed. For shortness of breath., Disp: 1 Inhaler, Rfl: 1 .  citalopram (CELEXA) 40 MG tablet, TAKE 1 TABLET BY MOUTH ONCE A DAY FOR MOOD, Disp: , Rfl:  .  gabapentin (NEURONTIN) 400 MG capsule, Take 400 mg by mouth 2 (two) times daily. , Disp: , Rfl:  .  lisinopril-hydrochlorothiazide (PRINZIDE,ZESTORETIC) 10-12.5 MG tablet, Take 1 tablet by mouth daily.,  Disp: , Rfl: 1 .  loratadine (CLARITIN) 10 MG tablet, TAKE 1 TABLET BY MOUTH ONCE A DAY FOR ALLERGIES, Disp: , Rfl:  .  meloxicam (MOBIC) 7.5 MG tablet, Take 1 tablet (7.5 mg total) by mouth daily., Disp: 30 tablet, Rfl: 5 .  metFORMIN (GLUCOPHAGE) 500 MG tablet, Take 250 mg by mouth daily with breakfast., Disp: , Rfl:  .  montelukast (SINGULAIR) 10 MG tablet, TAKE 1 ORAL TABLET ONCE A DAY FOR ALLERGIES, Disp: , Rfl:  .  nitrofurantoin, macrocrystal-monohydrate, (MACROBID) 100 MG capsule, TAKE 1 CAPSULE 2 TIMES A DAY FOR UTI, Disp: , Rfl:  .  pantoprazole (PROTONIX) 40 MG tablet, TAKE 1 ORAL TABLET ONCE A DAY FOR GERD., Disp: , Rfl:  .  pravastatin (PRAVACHOL) 10 MG tablet, TAKE 1 TABLET BY MOUTH EVERYDAY AT BEDTIME, Disp: , Rfl:  .  tiZANidine (ZANAFLEX) 4 MG tablet, Take 1 tablet (4 mg total) by mouth every 6 (six) hours as needed for muscle spasms., Disp: 30 tablet, Rfl: 0 .  traZODone (DESYREL) 50 MG tablet, Take 50 mg by mouth at bedtime., Disp: , Rfl:    PHYSICAL EXAM SECTION: 1) BP (!) 110/57   Pulse 91   Temp (!) 97.2 F (36.2 C)   Ht 5\' 3"  (1.6 m)   Wt 167 lb (75.8 kg)   BMI 29.58 kg/m   Body mass index is 29.58 kg/m. General appearance: Well-developed  well-nourished no gross deformities  2) Cardiovascular normal pulse and perfusion in the left upper extremity and left lower extremities normal color without edema  3) Neurologically deep tendon reflexes are equal and normal, no sensation loss or deficits no pathologic reflexes  4) Psychological: Awake alert and oriented x3 mood and affect normal  5) Skin no lacerations or ulcerations no nodularity no palpable masses, no erythema or nodularity  6) Musculoskeletal:   Left shoulder positive impingement sign tenderness in the periacromial region normal passive range of motion with pain muscle tone normal  Lower back tenderness and pain radiating to the left hip hip range of motion is normal thigh circumference is normal no  atrophy is noted   MEDICAL DECISION SECTION:  Encounter Diagnoses  Name Primary?  . Chronic bilateral low back pain with sciatica, sciatica laterality unspecified   . Chronic left shoulder pain     Imaging Prior imaging shows normal knees  No fracture dislocation arthritis of the shoulder  Degenerative disc disease lumbar spine  Plan:  (Rx., Inj., surg., Frx, MRI/CT, XR:2)  Meds ordered this encounter  Medications  . tiZANidine (ZANAFLEX) 4 MG tablet    Sig: Take 1 tablet (4 mg total) by mouth every 6 (six) hours as needed for muscle spasms.    Dispense:  30 tablet    Refill:  0  . acetaminophen-codeine (TYLENOL #3) 300-30 MG tablet    Sig: Take 1 tablet by mouth every 6 (six) hours as needed for moderate pain.    Dispense:  30 tablet    Refill:  0    Procedure note the subacromial injection shoulder left   Verbal consent was obtained to inject the  Left   Shoulder  Timeout was completed to confirm the injection site is a subacromial space of the  left  shoulder  Medication used Depo-Medrol 40 mg and lidocaine 1% 3 cc  Anesthesia was provided by ethyl chloride  The injection was performed in the left  posterior subacromial space. After pinning the skin with alcohol and anesthetized the skin with ethyl chloride the subacromial space was injected using a 20-gauge needle. There were no complications  Sterile dressing was applied.   We have also given an intramuscular injection in the left hip that was done by the nurse.  There were no complications.  Depo-Medrol 40 and lidocaine 1% injected  We have advised her to get an MRI of her lower back in preparation for referral for chronic pain  We injected her shoulder and stated we injected her hip for the back pain  We refill the medications and adjusted them to stop the tramadol Ultracet continue the tizanidine stop the ibuprofen as she is already on meloxicam  Problem #1 rotator cuff tendinitis and bursitis left  shoulder  Problem #2 chronic lower back pain  Problem #3 chronic pain  You have received an injection in the left shoulder and an intramuscular shot in your left hip.  Your primary problem is coming from the lower back causing pain radiating to your knee and hip  You will be scheduled to see a chronic pain manager to manage her chronic pain and prescribe your opioid medications which include Tylenol with codeine.  I can no longer prescribe that medicine per [facility] and federal guidelines.  You will have an MRI done of your back I will call you with your results and schedule you to see a back specialist for further care of your back and hip pain  Do not take ibuprofen  Continue tizanidine and ibuprofen.  Stop taking tramadol   10:56 AM Arther Abbott, MD  06/19/2019

## 2019-07-03 ENCOUNTER — Other Ambulatory Visit: Payer: Self-pay

## 2019-07-03 ENCOUNTER — Ambulatory Visit (HOSPITAL_COMMUNITY)
Admission: RE | Admit: 2019-07-03 | Discharge: 2019-07-03 | Disposition: A | Discharge status: Home / Self Care | Payer: Medicare Other | Source: Ambulatory Visit | Attending: Orthopedic Surgery | Admitting: Orthopedic Surgery

## 2019-07-03 DIAGNOSIS — G8929 Other chronic pain: Secondary | ICD-10-CM | POA: Diagnosis present

## 2019-07-03 DIAGNOSIS — M545 Low back pain: Secondary | ICD-10-CM | POA: Diagnosis not present

## 2019-07-03 DIAGNOSIS — M5440 Lumbago with sciatica, unspecified side: Secondary | ICD-10-CM | POA: Diagnosis not present

## 2019-07-04 ENCOUNTER — Telehealth: Payer: Self-pay | Admitting: Radiology

## 2019-07-04 ENCOUNTER — Telehealth: Payer: Self-pay | Admitting: Orthopedic Surgery

## 2019-07-04 DIAGNOSIS — G8929 Other chronic pain: Secondary | ICD-10-CM

## 2019-07-04 DIAGNOSIS — M5440 Lumbago with sciatica, unspecified side: Secondary | ICD-10-CM

## 2019-07-04 MED ORDER — TIZANIDINE HCL 4 MG PO TABS: 4.0000 mg | ORAL_TABLET | Freq: Four times a day (QID) | ORAL | 0 refills | Status: AC | PRN

## 2019-07-04 MED ORDER — HYDROCODONE-ACETAMINOPHEN 5-325 MG PO TABS: 1.0000 | ORAL_TABLET | Freq: Four times a day (QID) | ORAL | 0 refills | Status: AC | PRN

## 2019-07-04 MED ORDER — GABAPENTIN 300 MG PO CAPS: 300.0000 mg | ORAL_CAPSULE | Freq: Three times a day (TID) | ORAL | 5 refills | Status: AC

## 2019-07-04 MED ORDER — MELOXICAM 7.5 MG PO TABS: 7.5000 mg | ORAL_TABLET | Freq: Every day | ORAL | 5 refills | Status: DC

## 2019-07-04 NOTE — Telephone Encounter (Signed)
Barbara Wilcox was called about her MRI  She says she still having a lot of back and leg pain  IMPRESSION: 1. The dominant degenerative finding is mild grade 1 anterolisthesis of L5 on S1 with moderate to severe associated posterior element hypertrophy. Mild superimposed disc bulging. Moderate to severe left greater than right L5 neural foraminal stenosis. 2. Left eccentric disc bulging and moderate posterior element degeneration at L4-L5 with up to mild left foraminal and lateral recess stenosis. 3. Very mild for age lower thoracic and lumbar spine degeneration otherwise.     Electronically Signed   By: Genevie Ann M.D.   On: 07/03/2019 14:08  Recommend consult with neurosurgery  I will call her in some medication for pain at [facility]

## 2019-07-04 NOTE — Telephone Encounter (Signed)
-----   Message from Carole Civil, MD sent at 07/04/2019  1:21 PM EST ----- Neurosurgery referral

## 2019-07-17 ENCOUNTER — Other Ambulatory Visit: Payer: Self-pay

## 2019-07-17 DIAGNOSIS — G8929 Other chronic pain: Secondary | ICD-10-CM

## 2019-07-17 DIAGNOSIS — M4726 Other spondylosis with radiculopathy, lumbar region: Secondary | ICD-10-CM | POA: Diagnosis not present

## 2019-07-17 DIAGNOSIS — M5440 Lumbago with sciatica, unspecified side: Secondary | ICD-10-CM

## 2019-07-17 NOTE — Telephone Encounter (Signed)
Hydrocodone-Acetaminophen  5/325 mg  Qty 30 Tablets  Take 1 tablet by mouth every 6(six) hours as needed for moderate pain.  PATIENT USES [facility]

## 2019-07-18 ENCOUNTER — Telehealth: Payer: Self-pay | Admitting: Orthopedic Surgery

## 2019-07-18 DIAGNOSIS — G8929 Other chronic pain: Secondary | ICD-10-CM

## 2019-07-18 NOTE — Telephone Encounter (Signed)
Patient called to inquire about a refill on pain medication; said she is hurting; discussed her referral to [facility], which she initially said was not scheduled until 07/28/19.  Patient then said, "well, I did see the Doctor yesterday, 07/17/19, but he didn't do anything about medication".  I relayed that patient is now under [facility]/Dr [place] care; therefore,  Dr Aline Brochure would no longer prescribe or refill medication. Asked if I would re-check for her. Please advise.

## 2019-07-24 ENCOUNTER — Ambulatory Visit
Admission: EM | Admit: 2019-07-24 | Discharge: 2019-07-24 | Disposition: A | Discharge status: Home / Self Care | Payer: Medicare Other | Attending: Emergency Medicine | Admitting: Emergency Medicine

## 2019-07-24 DIAGNOSIS — M47816 Spondylosis without myelopathy or radiculopathy, lumbar region: Secondary | ICD-10-CM | POA: Diagnosis not present

## 2019-07-24 DIAGNOSIS — Z20822 Contact with and (suspected) exposure to covid-19: Secondary | ICD-10-CM

## 2019-07-24 DIAGNOSIS — Z7689 Persons encountering health services in other specified circumstances: Secondary | ICD-10-CM | POA: Diagnosis not present

## 2019-07-24 DIAGNOSIS — R05 Cough: Secondary | ICD-10-CM | POA: Diagnosis not present

## 2019-07-24 DIAGNOSIS — R059 Cough, unspecified: Secondary | ICD-10-CM

## 2019-07-24 DIAGNOSIS — M5416 Radiculopathy, lumbar region: Secondary | ICD-10-CM | POA: Diagnosis not present

## 2019-07-24 DIAGNOSIS — M48061 Spinal stenosis, lumbar region without neurogenic claudication: Secondary | ICD-10-CM | POA: Diagnosis not present

## 2019-07-24 MED ORDER — FLUTICASONE PROPIONATE 50 MCG/ACT NA SUSP: 1.0000 | Freq: Every day | NASAL | 0 refills | Status: AC

## 2019-07-24 MED ORDER — BENZONATATE 100 MG PO CAPS: 100.0000 mg | ORAL_CAPSULE | Freq: Three times a day (TID) | ORAL | 0 refills | Status: AC

## 2019-07-24 MED ORDER — CEPHALEXIN 250 MG PO CAPS: 250.0000 mg | ORAL_CAPSULE | Freq: Four times a day (QID) | ORAL | 0 refills | Status: AC

## 2019-07-24 NOTE — ED Triage Notes (Signed)
Pt presents with cough and c/o chills for past few days

## 2019-07-24 NOTE — ED Provider Notes (Signed)
[facility]    CSN: [id number] Arrival date & time: 07/24/19  1158      History   Chief Complaint Chief Complaint  Patient presents with  . Cough    HPI Barbara Wilcox is a 69 y.o. female.   Barbara Wilcox 69 years old female presented to the urgent care for complaint of cough and chills for the past 2 to 3 days.  Denies sick exposure to COVID, flu or strep.  Denies recent travel.  Denies aggravating or alleviating symptoms.  Denies previous COVID infection.   Denies fever, chills, fatigue, nasal congestion, rhinorrhea, sore throat, SOB, wheezing, chest pain, nausea, vomiting, changes in bowel or bladder habits.    The history is provided by the patient. No language interpreter was used.  Cough Associated symptoms: chills     Past Medical History:  Diagnosis Date  . Acid reflux   . Arthritis   . Asthma   . Bronchitis   . Bursitis   . Diabetes mellitus with retinopathy ([place])   . Hypertension     Patient Active Problem List   Diagnosis Date Noted  . E coli bacteremia   . Sepsis secondary to UTI ([place]) 12/24/2018  . Acute renal failure superimposed on stage 3 chronic kidney disease ([place]) 12/24/2018  . Influenza A 08/08/2018  . Hypertension   . Type 2 diabetes with nephropathy ([place])   . Epistaxis 12/12/2014  . TOBACCO USER 05/02/2007  . Asthma 03/21/2007  . GERD 03/21/2007  . MENOPAUSE-RELATED VASOMOTOR SYMPTOMS, HOT FLASHES 03/21/2007    Past Surgical History:  Procedure Laterality Date  . ABDOMINAL HYSTERECTOMY    . TOOTH EXTRACTION      OB History    Gravida  1   Para  1   Term  1   Preterm      AB      Living        SAB      TAB      Ectopic      Multiple      Live Births               Home Medications    Prior to Admission medications   Medication Sig Start Date End Date Taking? Authorizing Provider  acetaminophen-codeine (TYLENOL #3) 300-30 MG tablet Take 1 tablet by mouth every 6 (six) hours as needed for  moderate pain. 06/19/19   Carole Civil, MD  albuterol (PROVENTIL HFA;VENTOLIN HFA) 108 (90 Base) MCG/ACT inhaler Inhale 2 puffs into the lungs every 6 (six) hours as needed. For shortness of breath. 08/09/18   Roxan Hockey, MD  benzonatate (TESSALON) 100 MG capsule Take 1 capsule (100 mg total) by mouth every 8 (eight) hours. 07/24/19   Icie Kuznicki, Darrelyn Hillock, FNP  citalopram (CELEXA) 40 MG tablet TAKE 1 TABLET BY MOUTH ONCE A DAY FOR MOOD 12/28/18   [provider]  fluticasone (FLONASE) 50 MCG/ACT nasal spray Place 1 spray into both nostrils daily for 14 days. 07/24/19 08/07/19  Paradise Vensel, Darrelyn Hillock, FNP  gabapentin (NEURONTIN) 300 MG capsule Take 1 capsule (300 mg total) by mouth 3 (three) times daily. 07/04/19   Carole Civil, MD  HYDROcodone-acetaminophen (NORCO/VICODIN) 5-325 MG tablet Take 1 tablet by mouth every 6 (six) hours as needed for moderate pain. 07/04/19   Carole Civil, MD  lisinopril-hydrochlorothiazide (PRINZIDE,ZESTORETIC) 10-12.5 MG tablet Take 1 tablet by mouth daily. 03/16/18   [provider]  loratadine (CLARITIN) 10 MG tablet TAKE 1  TABLET BY MOUTH ONCE A DAY FOR ALLERGIES 12/28/18   [provider]  meloxicam (MOBIC) 7.5 MG tablet Take 1 tablet (7.5 mg total) by mouth daily. 07/04/19   Carole Civil, MD  metFORMIN (GLUCOPHAGE) 500 MG tablet Take 250 mg by mouth daily with breakfast.    [provider]  montelukast (SINGULAIR) 10 MG tablet TAKE 1 ORAL TABLET ONCE A DAY FOR ALLERGIES 12/28/18   [provider]  nitrofurantoin, macrocrystal-monohydrate, (MACROBID) 100 MG capsule TAKE 1 CAPSULE 2 TIMES A DAY FOR UTI 01/17/19   [provider]  pantoprazole (PROTONIX) 40 MG tablet TAKE 1 ORAL TABLET ONCE A DAY FOR GERD. 12/28/18   [provider]  pravastatin (PRAVACHOL) 10 MG tablet TAKE 1 TABLET BY MOUTH EVERYDAY AT BEDTIME 12/28/18   [provider]  tiZANidine (ZANAFLEX) 4 MG tablet Take 1  tablet (4 mg total) by mouth every 6 (six) hours as needed for muscle spasms. 07/04/19   Carole Civil, MD  traZODone (DESYREL) 50 MG tablet Take 50 mg by mouth at bedtime.    [provider]    Family History Family History  Problem Relation Age of Onset  . Chronic Renal Failure Sister        on HD  . Bone cancer Brother     Social History Social History   Tobacco Use  . Smoking status: Current Every Day Smoker    Packs/day: 0.50    Years: 36.00    Pack years: 18.00    Types: Cigarettes  . Smokeless tobacco: Never Used  Substance Use Topics  . Alcohol use: Yes    Comment: rare  . Drug use: Yes    Types: Marijuana    Comment: occasionally; last use a while ago     Allergies   Aspirin   Review of Systems Review of Systems  Constitutional: Positive for chills.  HENT: Negative.   Respiratory: Positive for cough.   Cardiovascular: Negative.   Gastrointestinal: Negative.   Neurological: Negative.      Physical Exam Triage Vital Signs ED Triage Vitals  Enc Vitals Group     BP 07/24/19 1223 118/69     Pulse Rate 07/24/19 1223 85     Resp 07/24/19 1223 16     Temp 07/24/19 1223 98.1 F (36.7 C)     Temp Source 07/24/19 1223 Oral     SpO2 07/24/19 1223 98 %     Weight --      Height --      Head Circumference --      Peak Flow --      Pain Score 07/24/19 1229 0     Pain Loc --      Pain Edu? --      Excl. in [place]? --    No data found.  Updated Vital Signs BP 118/69 (BP Location: Right Arm)   Pulse 85   Temp 98.1 F (36.7 C) (Oral)   Resp 16   SpO2 98%   Visual Acuity Right Eye Distance:   Left Eye Distance:   Bilateral Distance:    Right Eye Near:   Left Eye Near:    Bilateral Near:     Physical Exam Vitals and nursing note reviewed.  Constitutional:      General: She is not in acute distress.    Appearance: Normal appearance. She is normal weight. She is not ill-appearing or toxic-appearing.  HENT:     Head:  Normocephalic.  Right Ear: Tympanic membrane, ear canal and external ear normal. There is no impacted cerumen.     Left Ear: Tympanic membrane, ear canal and external ear normal. There is no impacted cerumen.     Nose: Nose normal. No congestion.     Mouth/Throat:     Mouth: Mucous membranes are moist.     Pharynx: No oropharyngeal exudate or posterior oropharyngeal erythema.  Cardiovascular:     Rate and Rhythm: Normal rate and regular rhythm.     Pulses: Normal pulses.     Heart sounds: Normal heart sounds. No murmur.  Pulmonary:     Effort: Pulmonary effort is normal. No respiratory distress.     Breath sounds: Normal breath sounds. No wheezing or rhonchi.  Chest:     Chest wall: No tenderness.  Abdominal:     General: Abdomen is flat. Bowel sounds are normal. There is no distension.     Palpations: There is no mass.  Skin:    Capillary Refill: Capillary refill takes less than 2 seconds.  Neurological:     Mental Status: She is alert and oriented to person, place, and time.      UC Treatments / Results  Labs (all labs ordered are listed, but only abnormal results are displayed) Labs Reviewed  NOVEL CORONAVIRUS, NAA    EKG   Radiology No results found.  Procedures Procedures (including critical care time)  Medications Ordered in UC Medications - No data to display  Initial Impression / Assessment and Plan / UC Course  I have reviewed the triage vital signs and the nursing notes.  Pertinent labs & imaging results that were available during my care of the patient were reviewed by me and considered in my medical decision making (see chart for details).    COVID-19 test was ordered.  Patient is stable and in no acute distress.  Advised patient to quarantine until COVID-19 test result become available.  To go to ED for worsening of symptoms.  Patient verbalized understanding of the plan of care.  Final Clinical Impressions(s) / UC Diagnoses   Final diagnoses:   Suspected COVID-19 virus infection  Cough     Discharge Instructions     COVID testing ordered.  It will take between 2-7 days for test results.  Someone will contact you regarding abnormal results.    In the meantime: You should remain isolated in your home for 10 days from symptom onset AND greater than 72 hours after symptoms resolution (absence of fever without the use of fever-reducing medication and improvement in respiratory symptoms), whichever is longer Get plenty of rest and push fluids Tessalon Perles prescribed for cough Flonase prescribed for nasal congestion and runny nose Use medications daily for symptom relief Use OTC medications like ibuprofen or tylenol as needed fever or pain Call or go to the ED if you have any new or worsening symptoms such as fever, worsening cough, shortness of breath, chest tightness, chest pain, turning blue, changes in mental status, etc...     ED Prescriptions    Medication Sig Dispense Auth. Provider   fluticasone (FLONASE) 50 MCG/ACT nasal spray Place 1 spray into both nostrils daily for 14 days. 16 g Marlane Hirschmann S, FNP   benzonatate (TESSALON) 100 MG capsule Take 1 capsule (100 mg total) by mouth every 8 (eight) hours. 21 capsule Kiaya Haliburton, Darrelyn Hillock, FNP     PDMP not reviewed this encounter.   Emerson Monte, [place] 07/24/19 1243

## 2019-07-24 NOTE — Discharge Instructions (Signed)
COVID testing ordered.  It will take between 2-7 days for test results.  Someone will contact you regarding abnormal results.    In the meantime: You should remain isolated in your home for 10 days from symptom onset AND greater than 72 hours after symptoms resolution (absence of fever without the use of fever-reducing medication and improvement in respiratory symptoms), whichever is longer Get plenty of rest and push fluids Tessalon Perles prescribed for cough Flonase prescribed for nasal congestion and runny nose Use medications daily for symptom relief Use OTC medications like ibuprofen or tylenol as needed fever or pain Call or go to the ED if you have any new or worsening symptoms such as fever, worsening cough, shortness of breath, chest tightness, chest pain, turning blue, changes in mental status, etc..Marland Kitchen

## 2019-07-25 LAB — NOVEL CORONAVIRUS, NAA: SARS-CoV-2, NAA: NOT DETECTED

## 2019-08-09 DIAGNOSIS — M5416 Radiculopathy, lumbar region: Secondary | ICD-10-CM | POA: Diagnosis not present

## 2019-08-09 DIAGNOSIS — M48061 Spinal stenosis, lumbar region without neurogenic claudication: Secondary | ICD-10-CM | POA: Diagnosis not present

## 2019-09-05 DIAGNOSIS — M5416 Radiculopathy, lumbar region: Secondary | ICD-10-CM | POA: Diagnosis not present

## 2019-10-17 DIAGNOSIS — I10 Essential (primary) hypertension: Secondary | ICD-10-CM | POA: Diagnosis not present

## 2019-10-17 DIAGNOSIS — E785 Hyperlipidemia, unspecified: Secondary | ICD-10-CM | POA: Diagnosis not present

## 2019-10-17 DIAGNOSIS — E1121 Type 2 diabetes mellitus with diabetic nephropathy: Secondary | ICD-10-CM | POA: Diagnosis not present

## 2019-10-19 DIAGNOSIS — M25562 Pain in left knee: Secondary | ICD-10-CM | POA: Diagnosis not present

## 2019-10-19 DIAGNOSIS — M25561 Pain in right knee: Secondary | ICD-10-CM | POA: Diagnosis not present

## 2019-10-19 DIAGNOSIS — E1121 Type 2 diabetes mellitus with diabetic nephropathy: Secondary | ICD-10-CM | POA: Diagnosis not present

## 2019-10-19 DIAGNOSIS — I129 Hypertensive chronic kidney disease with stage 1 through stage 4 chronic kidney disease, or unspecified chronic kidney disease: Secondary | ICD-10-CM | POA: Diagnosis not present

## 2019-10-19 DIAGNOSIS — G894 Chronic pain syndrome: Secondary | ICD-10-CM | POA: Diagnosis not present

## 2019-11-03 ENCOUNTER — Other Ambulatory Visit: Payer: Self-pay

## 2019-11-03 ENCOUNTER — Other Ambulatory Visit: Payer: Self-pay | Admitting: Family Medicine

## 2019-11-03 DIAGNOSIS — Z1231 Encounter for screening mammogram for malignant neoplasm of breast: Secondary | ICD-10-CM

## 2019-11-13 ENCOUNTER — Other Ambulatory Visit (HOSPITAL_COMMUNITY): Payer: Self-pay | Admitting: Family Medicine

## 2019-11-13 DIAGNOSIS — Z1231 Encounter for screening mammogram for malignant neoplasm of breast: Secondary | ICD-10-CM

## 2019-11-14 DIAGNOSIS — M545 Low back pain: Secondary | ICD-10-CM | POA: Diagnosis not present

## 2019-11-14 DIAGNOSIS — G894 Chronic pain syndrome: Secondary | ICD-10-CM | POA: Diagnosis not present

## 2019-11-14 DIAGNOSIS — R350 Frequency of micturition: Secondary | ICD-10-CM | POA: Diagnosis not present

## 2019-11-16 ENCOUNTER — Encounter (HOSPITAL_COMMUNITY): Payer: Self-pay

## 2019-11-16 ENCOUNTER — Other Ambulatory Visit: Payer: Self-pay

## 2019-11-16 ENCOUNTER — Ambulatory Visit (HOSPITAL_COMMUNITY)
Admission: RE | Admit: 2019-11-16 | Discharge: 2019-11-16 | Disposition: A | Discharge status: Home / Self Care | Payer: Medicare Other | Source: Ambulatory Visit | Attending: Family Medicine | Admitting: Family Medicine

## 2019-11-16 DIAGNOSIS — Z1231 Encounter for screening mammogram for malignant neoplasm of breast: Secondary | ICD-10-CM | POA: Diagnosis not present

## 2019-11-27 ENCOUNTER — Other Ambulatory Visit: Payer: Self-pay | Admitting: Nephrology

## 2019-11-27 ENCOUNTER — Other Ambulatory Visit (HOSPITAL_COMMUNITY): Payer: Self-pay | Admitting: Nephrology

## 2019-11-27 DIAGNOSIS — Z79899 Other long term (current) drug therapy: Secondary | ICD-10-CM | POA: Diagnosis not present

## 2019-11-27 DIAGNOSIS — N189 Chronic kidney disease, unspecified: Secondary | ICD-10-CM | POA: Diagnosis not present

## 2019-11-27 DIAGNOSIS — R809 Proteinuria, unspecified: Secondary | ICD-10-CM | POA: Diagnosis not present

## 2019-11-27 DIAGNOSIS — I129 Hypertensive chronic kidney disease with stage 1 through stage 4 chronic kidney disease, or unspecified chronic kidney disease: Secondary | ICD-10-CM | POA: Diagnosis not present

## 2019-11-27 DIAGNOSIS — E1122 Type 2 diabetes mellitus with diabetic chronic kidney disease: Secondary | ICD-10-CM

## 2019-11-27 DIAGNOSIS — Z716 Tobacco abuse counseling: Secondary | ICD-10-CM

## 2019-11-29 DIAGNOSIS — N183 Chronic kidney disease, stage 3 unspecified: Secondary | ICD-10-CM | POA: Diagnosis not present

## 2019-11-29 DIAGNOSIS — G894 Chronic pain syndrome: Secondary | ICD-10-CM | POA: Diagnosis not present

## 2019-11-29 DIAGNOSIS — E1121 Type 2 diabetes mellitus with diabetic nephropathy: Secondary | ICD-10-CM | POA: Diagnosis not present

## 2019-11-29 DIAGNOSIS — J309 Allergic rhinitis, unspecified: Secondary | ICD-10-CM | POA: Diagnosis not present

## 2019-12-07 ENCOUNTER — Other Ambulatory Visit (HOSPITAL_COMMUNITY): Payer: Self-pay | Admitting: General Practice

## 2019-12-07 ENCOUNTER — Other Ambulatory Visit: Payer: Self-pay

## 2019-12-07 ENCOUNTER — Other Ambulatory Visit (HOSPITAL_COMMUNITY): Payer: Self-pay | Admitting: Adult Health

## 2019-12-07 ENCOUNTER — Ambulatory Visit (HOSPITAL_COMMUNITY)
Admission: RE | Admit: 2019-12-07 | Discharge: 2019-12-07 | Disposition: A | Discharge status: Home / Self Care | Payer: Medicare Other | Source: Ambulatory Visit | Attending: Adult Health | Admitting: Adult Health

## 2019-12-07 DIAGNOSIS — R059 Cough, unspecified: Secondary | ICD-10-CM

## 2019-12-07 DIAGNOSIS — R05 Cough: Secondary | ICD-10-CM | POA: Insufficient documentation

## 2019-12-07 DIAGNOSIS — R079 Chest pain, unspecified: Secondary | ICD-10-CM | POA: Diagnosis not present

## 2019-12-13 ENCOUNTER — Encounter (HOSPITAL_COMMUNITY): Payer: Self-pay

## 2019-12-13 ENCOUNTER — Ambulatory Visit (HOSPITAL_COMMUNITY): Payer: Medicare Other | Attending: Nephrology

## 2019-12-13 DIAGNOSIS — Z79891 Long term (current) use of opiate analgesic: Secondary | ICD-10-CM | POA: Diagnosis not present

## 2019-12-13 DIAGNOSIS — R05 Cough: Secondary | ICD-10-CM | POA: Diagnosis not present

## 2019-12-13 DIAGNOSIS — G894 Chronic pain syndrome: Secondary | ICD-10-CM | POA: Diagnosis not present

## 2019-12-19 DIAGNOSIS — N189 Chronic kidney disease, unspecified: Secondary | ICD-10-CM | POA: Diagnosis not present

## 2019-12-19 DIAGNOSIS — E1122 Type 2 diabetes mellitus with diabetic chronic kidney disease: Secondary | ICD-10-CM | POA: Diagnosis not present

## 2019-12-19 DIAGNOSIS — I129 Hypertensive chronic kidney disease with stage 1 through stage 4 chronic kidney disease, or unspecified chronic kidney disease: Secondary | ICD-10-CM | POA: Diagnosis not present

## 2019-12-19 DIAGNOSIS — E1129 Type 2 diabetes mellitus with other diabetic kidney complication: Secondary | ICD-10-CM | POA: Diagnosis not present

## 2019-12-19 DIAGNOSIS — Z79899 Other long term (current) drug therapy: Secondary | ICD-10-CM | POA: Diagnosis not present

## 2019-12-19 DIAGNOSIS — E559 Vitamin D deficiency, unspecified: Secondary | ICD-10-CM | POA: Diagnosis not present

## 2019-12-19 DIAGNOSIS — R809 Proteinuria, unspecified: Secondary | ICD-10-CM | POA: Diagnosis not present

## 2019-12-26 ENCOUNTER — Ambulatory Visit (HOSPITAL_COMMUNITY)
Admission: RE | Admit: 2019-12-26 | Discharge: 2019-12-26 | Disposition: A | Discharge status: Home / Self Care | Payer: Medicare Other | Source: Ambulatory Visit | Attending: Nephrology | Admitting: Nephrology

## 2019-12-26 ENCOUNTER — Other Ambulatory Visit: Payer: Self-pay

## 2019-12-26 DIAGNOSIS — Z79899 Other long term (current) drug therapy: Secondary | ICD-10-CM | POA: Insufficient documentation

## 2019-12-26 DIAGNOSIS — M25561 Pain in right knee: Secondary | ICD-10-CM | POA: Diagnosis not present

## 2019-12-26 DIAGNOSIS — N189 Chronic kidney disease, unspecified: Secondary | ICD-10-CM | POA: Diagnosis not present

## 2019-12-26 DIAGNOSIS — I129 Hypertensive chronic kidney disease with stage 1 through stage 4 chronic kidney disease, or unspecified chronic kidney disease: Secondary | ICD-10-CM | POA: Diagnosis not present

## 2019-12-26 DIAGNOSIS — Z716 Tobacco abuse counseling: Secondary | ICD-10-CM | POA: Insufficient documentation

## 2019-12-26 DIAGNOSIS — E1122 Type 2 diabetes mellitus with diabetic chronic kidney disease: Secondary | ICD-10-CM | POA: Insufficient documentation

## 2019-12-29 DIAGNOSIS — R768 Other specified abnormal immunological findings in serum: Secondary | ICD-10-CM | POA: Diagnosis not present

## 2019-12-29 DIAGNOSIS — E559 Vitamin D deficiency, unspecified: Secondary | ICD-10-CM | POA: Diagnosis not present

## 2019-12-29 DIAGNOSIS — N189 Chronic kidney disease, unspecified: Secondary | ICD-10-CM | POA: Diagnosis not present

## 2019-12-29 DIAGNOSIS — I129 Hypertensive chronic kidney disease with stage 1 through stage 4 chronic kidney disease, or unspecified chronic kidney disease: Secondary | ICD-10-CM | POA: Diagnosis not present

## 2019-12-29 DIAGNOSIS — D631 Anemia in chronic kidney disease: Secondary | ICD-10-CM | POA: Diagnosis not present

## 2020-01-08 ENCOUNTER — Other Ambulatory Visit: Payer: Self-pay | Admitting: Orthopedic Surgery

## 2020-01-11 DIAGNOSIS — J4521 Mild intermittent asthma with (acute) exacerbation: Secondary | ICD-10-CM | POA: Diagnosis not present

## 2020-01-11 DIAGNOSIS — R05 Cough: Secondary | ICD-10-CM | POA: Diagnosis not present

## 2020-01-11 DIAGNOSIS — I10 Essential (primary) hypertension: Secondary | ICD-10-CM | POA: Diagnosis not present

## 2020-01-11 DIAGNOSIS — G894 Chronic pain syndrome: Secondary | ICD-10-CM | POA: Diagnosis not present

## 2020-01-11 DIAGNOSIS — J309 Allergic rhinitis, unspecified: Secondary | ICD-10-CM | POA: Diagnosis not present

## 2020-02-29 ENCOUNTER — Telehealth: Payer: Self-pay | Admitting: Radiology

## 2020-02-29 NOTE — Telephone Encounter (Signed)
Patient asked about her Neurosurgery referral, wanted [place] to send again I found out she did keep appointment in January so she is established already. She can call to schedule since she is already established. I called to give her the number  [phone] Left message for her to advise.

## 2020-03-07 DIAGNOSIS — E1122 Type 2 diabetes mellitus with diabetic chronic kidney disease: Secondary | ICD-10-CM | POA: Diagnosis not present

## 2020-03-07 DIAGNOSIS — E559 Vitamin D deficiency, unspecified: Secondary | ICD-10-CM | POA: Diagnosis not present

## 2020-03-07 DIAGNOSIS — N189 Chronic kidney disease, unspecified: Secondary | ICD-10-CM | POA: Diagnosis not present

## 2020-03-07 DIAGNOSIS — I129 Hypertensive chronic kidney disease with stage 1 through stage 4 chronic kidney disease, or unspecified chronic kidney disease: Secondary | ICD-10-CM | POA: Diagnosis not present

## 2020-03-07 DIAGNOSIS — R768 Other specified abnormal immunological findings in serum: Secondary | ICD-10-CM | POA: Diagnosis not present

## 2020-03-08 DIAGNOSIS — D631 Anemia in chronic kidney disease: Secondary | ICD-10-CM | POA: Diagnosis not present

## 2020-03-08 DIAGNOSIS — E211 Secondary hyperparathyroidism, not elsewhere classified: Secondary | ICD-10-CM | POA: Diagnosis not present

## 2020-03-08 DIAGNOSIS — E559 Vitamin D deficiency, unspecified: Secondary | ICD-10-CM | POA: Diagnosis not present

## 2020-03-08 DIAGNOSIS — N189 Chronic kidney disease, unspecified: Secondary | ICD-10-CM | POA: Diagnosis not present

## 2020-03-08 DIAGNOSIS — I129 Hypertensive chronic kidney disease with stage 1 through stage 4 chronic kidney disease, or unspecified chronic kidney disease: Secondary | ICD-10-CM | POA: Diagnosis not present

## 2020-04-17 DIAGNOSIS — M48061 Spinal stenosis, lumbar region without neurogenic claudication: Secondary | ICD-10-CM | POA: Diagnosis not present

## 2020-04-17 DIAGNOSIS — M5416 Radiculopathy, lumbar region: Secondary | ICD-10-CM | POA: Diagnosis not present

## 2020-06-19 DIAGNOSIS — Z0000 Encounter for general adult medical examination without abnormal findings: Secondary | ICD-10-CM | POA: Diagnosis not present

## 2020-06-19 DIAGNOSIS — G894 Chronic pain syndrome: Secondary | ICD-10-CM | POA: Diagnosis not present

## 2020-06-19 DIAGNOSIS — E785 Hyperlipidemia, unspecified: Secondary | ICD-10-CM | POA: Diagnosis not present

## 2020-06-19 DIAGNOSIS — E1121 Type 2 diabetes mellitus with diabetic nephropathy: Secondary | ICD-10-CM | POA: Diagnosis not present

## 2020-06-19 DIAGNOSIS — I10 Essential (primary) hypertension: Secondary | ICD-10-CM | POA: Diagnosis not present

## 2020-07-17 DIAGNOSIS — G894 Chronic pain syndrome: Secondary | ICD-10-CM | POA: Diagnosis not present

## 2020-07-23 DIAGNOSIS — N189 Chronic kidney disease, unspecified: Secondary | ICD-10-CM | POA: Diagnosis not present

## 2020-07-23 DIAGNOSIS — E1121 Type 2 diabetes mellitus with diabetic nephropathy: Secondary | ICD-10-CM | POA: Diagnosis not present

## 2020-07-23 DIAGNOSIS — I10 Essential (primary) hypertension: Secondary | ICD-10-CM | POA: Diagnosis not present

## 2020-07-23 DIAGNOSIS — R531 Weakness: Secondary | ICD-10-CM | POA: Diagnosis not present

## 2020-07-23 DIAGNOSIS — E785 Hyperlipidemia, unspecified: Secondary | ICD-10-CM | POA: Diagnosis not present

## 2020-07-23 DIAGNOSIS — G894 Chronic pain syndrome: Secondary | ICD-10-CM | POA: Diagnosis not present

## 2020-07-23 DIAGNOSIS — R809 Proteinuria, unspecified: Secondary | ICD-10-CM | POA: Diagnosis not present

## 2020-07-23 DIAGNOSIS — I129 Hypertensive chronic kidney disease with stage 1 through stage 4 chronic kidney disease, or unspecified chronic kidney disease: Secondary | ICD-10-CM | POA: Diagnosis not present

## 2020-07-23 DIAGNOSIS — E1122 Type 2 diabetes mellitus with diabetic chronic kidney disease: Secondary | ICD-10-CM | POA: Diagnosis not present

## 2020-07-23 DIAGNOSIS — D472 Monoclonal gammopathy: Secondary | ICD-10-CM | POA: Diagnosis not present

## 2020-07-24 DIAGNOSIS — N189 Chronic kidney disease, unspecified: Secondary | ICD-10-CM | POA: Diagnosis not present

## 2020-07-24 DIAGNOSIS — D472 Monoclonal gammopathy: Secondary | ICD-10-CM | POA: Diagnosis not present

## 2020-07-24 DIAGNOSIS — D631 Anemia in chronic kidney disease: Secondary | ICD-10-CM | POA: Diagnosis not present

## 2020-07-24 DIAGNOSIS — E211 Secondary hyperparathyroidism, not elsewhere classified: Secondary | ICD-10-CM | POA: Diagnosis not present

## 2020-07-24 DIAGNOSIS — E1122 Type 2 diabetes mellitus with diabetic chronic kidney disease: Secondary | ICD-10-CM | POA: Diagnosis not present

## 2020-07-24 DIAGNOSIS — I129 Hypertensive chronic kidney disease with stage 1 through stage 4 chronic kidney disease, or unspecified chronic kidney disease: Secondary | ICD-10-CM | POA: Diagnosis not present

## 2020-07-24 DIAGNOSIS — R809 Proteinuria, unspecified: Secondary | ICD-10-CM | POA: Diagnosis not present

## 2020-08-08 IMAGING — MR MR LUMBAR SPINE W/O CM
4 of 5 series · 12 of 48 positions shown · non-contrast
Comparison: Lumbar radiographs 01/23/2019.

CLINICAL DATA: 68-year-old female with low back pain radiating down
both legs for 2 months with no known injury.

EXAM:
MRI LUMBAR SPINE WITHOUT CONTRAST
TECHNIQUE: Multiplanar, multisequence MR imaging of the lumbar spine was
performed. No intravenous contrast was administered.

[Series 3: T2 · sagittal · 4.0mm · 0.41mm/px · 3 of 15 slices shown (1 of 2)]
[im 3/15]
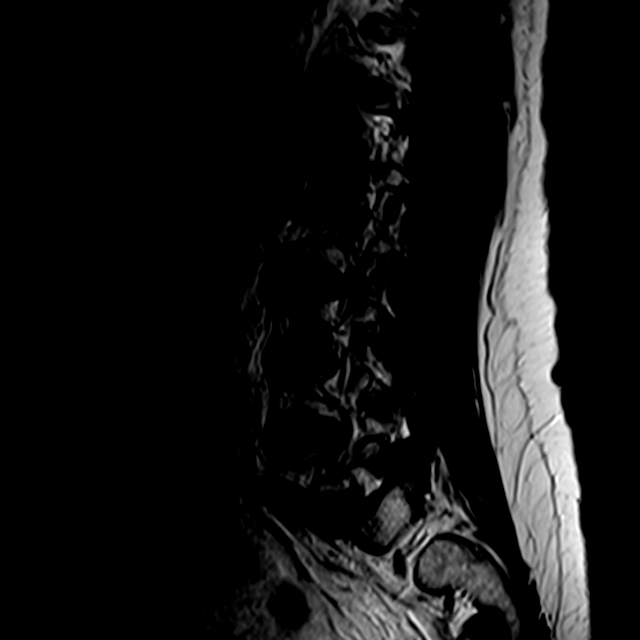
[im 8/15]
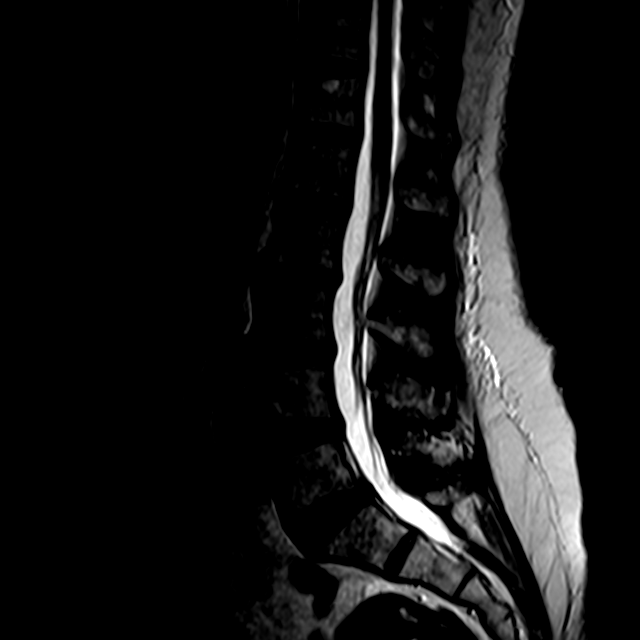
[im 12/15]
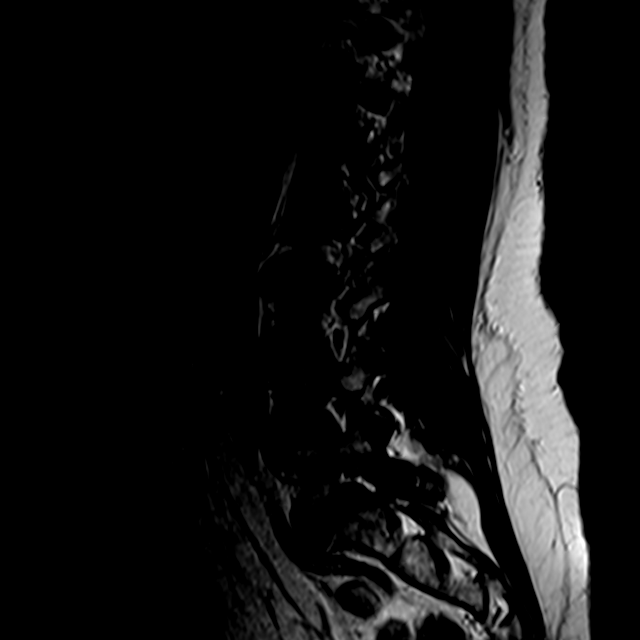

[Series 4: T1 · sagittal · 4.0mm · 0.41mm/px · 3 of 15 slices shown (1 of 2)]
[im 3/15]
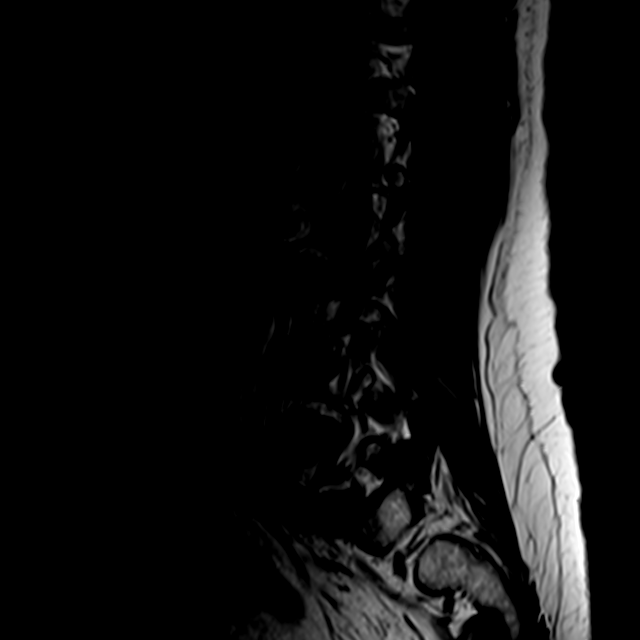
[im 9/15]
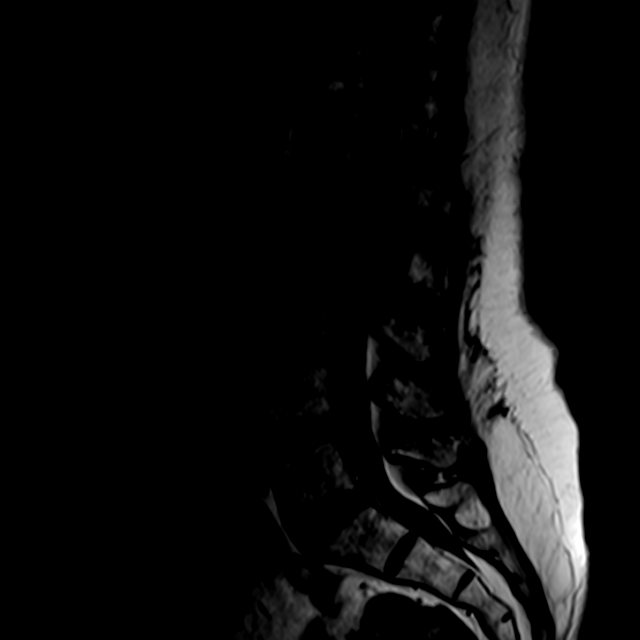
[im 15/15]
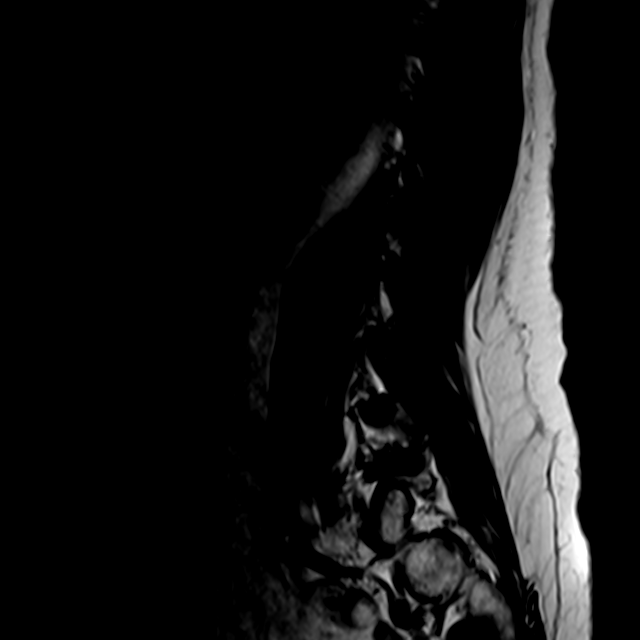

[Series 6: T2 · axial · 4.0mm · 0.24mm/px · z∈[-14,+106]mm · 3 of 36 slices shown (2 of 2)]
[im 6/36]
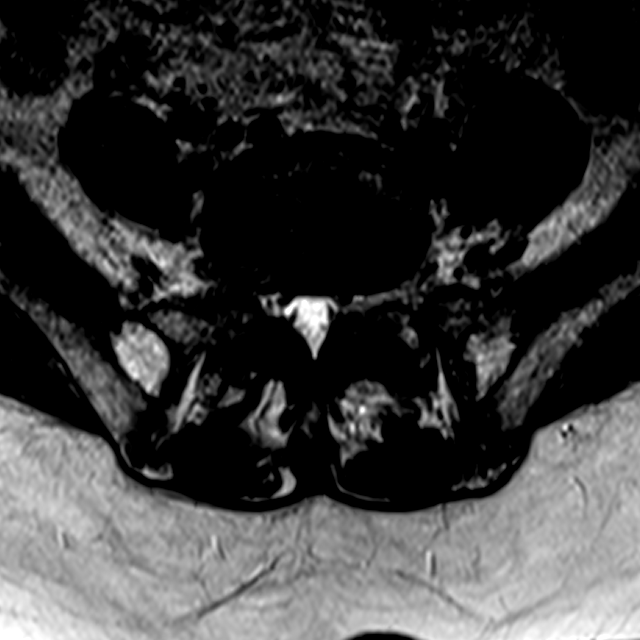
[im 19/36]
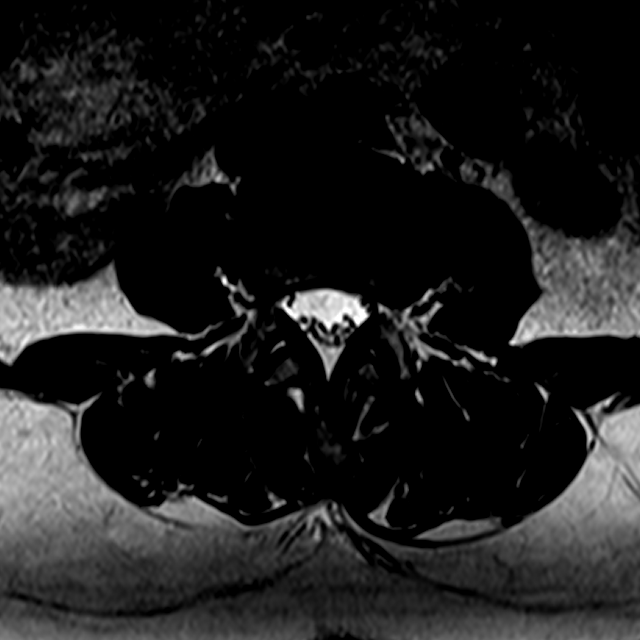
[im 30/36]
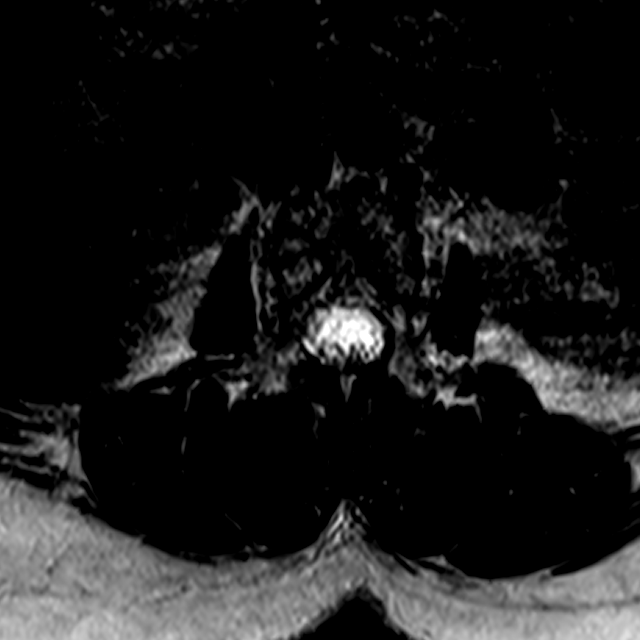

[Series 7: T1 · axial · 4.0mm · 0.24mm/px · z∈[-14,+106]mm · 3 of 36 slices shown (2 of 2)]
[im 6/36]
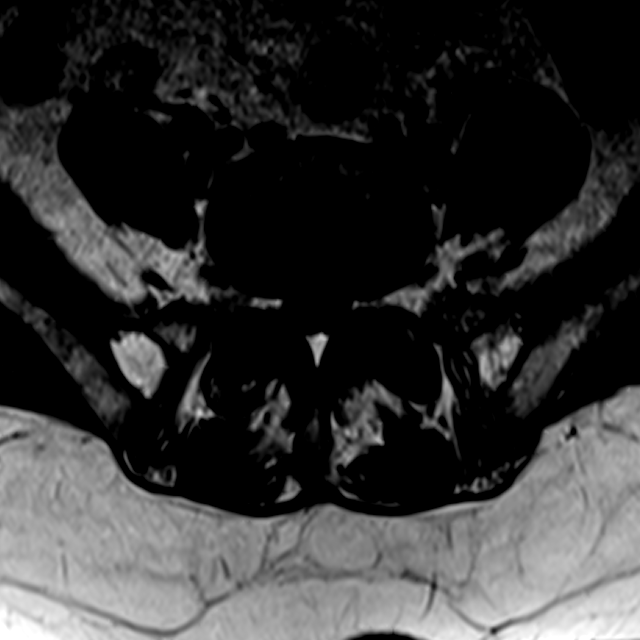
[im 19/36]
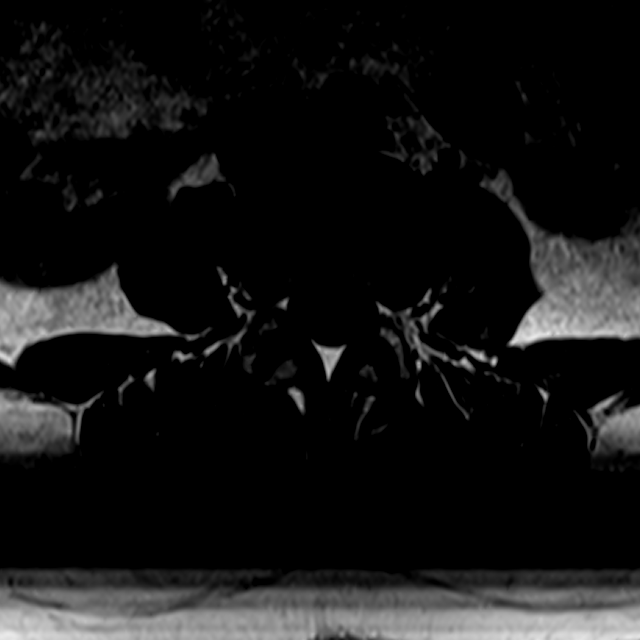
[im 30/36]
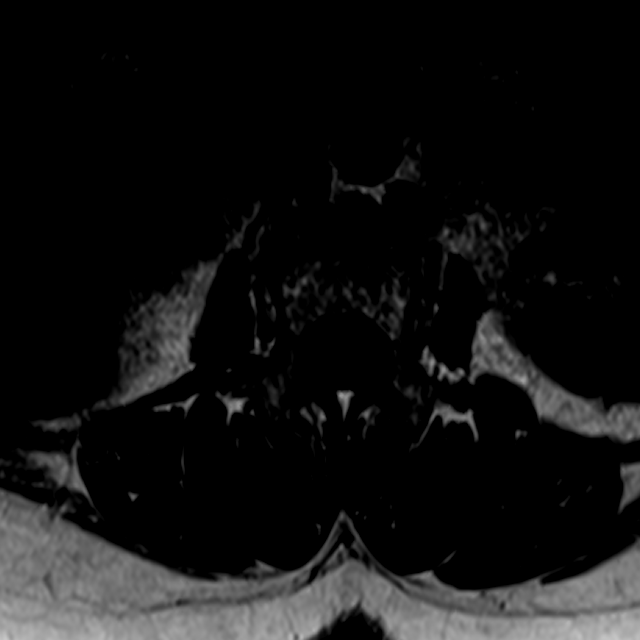

[12 of 48 positions shown; findings below may reference images not displayed]

FINDINGS: Segmentation:  Normal on the comparison radiographs.

Alignment: Stable lumbar lordosis since [REDACTED]. There is mild grade 1
anterolisthesis of L5 on S1, measuring 2-3 millimeters. Subtle
similar retrolisthesis of L2 on L3.

Vertebrae: No marrow edema or evidence of acute osseous abnormality.
Visualized bone marrow signal is within normal limits. Intact
visible sacrum and SI joints.

Conus medullaris and cauda equina: Conus extends to the L1 level. No
lower spinal cord or conus signal abnormality.

Paraspinal and other soft tissues: Visualized abdominal viscera and
paraspinal soft tissues are within normal limits.

Disc levels:

Negative visible lower thoracic spine through L1-L2.

L2-L3:  Mild circumferential disc bulge.  No stenosis.

L3-L4: Mild circumferential disc bulge. Borderline to mild facet
hypertrophy. No stenosis.

L4-L5: Left eccentric circumferential and far lateral disc bulging.
Mild to moderate facet and ligament flavum hypertrophy greater on
the left. Borderline to mild left lateral recess stenosis and left
foraminal stenosis (left L5 and left L4 nerve levels, respectively).

L5-S1: Mild anterolisthesis. Mild circumferential disc bulge with
severe facet and ligament flavum hypertrophy. No spinal stenosis.
And only borderline to mild bilateral lateral recess stenosis. There
is moderate to severe left and moderate right L5 foraminal stenosis.
IMPRESSION: 1. The dominant degenerative finding is mild grade 1 anterolisthesis
of L5 on S1 with moderate to severe associated posterior element
hypertrophy. Mild superimposed disc bulging. Moderate to severe left
greater than right L5 neural foraminal stenosis.
2. Left eccentric disc bulging and moderate posterior element
degeneration at L4-L5 with up to mild left foraminal and lateral
recess stenosis.
3. Very mild for age lower thoracic and lumbar spine degeneration
otherwise.

## 2020-09-19 DIAGNOSIS — I129 Hypertensive chronic kidney disease with stage 1 through stage 4 chronic kidney disease, or unspecified chronic kidney disease: Secondary | ICD-10-CM | POA: Diagnosis not present

## 2020-09-19 DIAGNOSIS — Z5181 Encounter for therapeutic drug level monitoring: Secondary | ICD-10-CM | POA: Diagnosis not present

## 2020-09-19 DIAGNOSIS — M545 Low back pain, unspecified: Secondary | ICD-10-CM | POA: Diagnosis not present

## 2020-09-19 DIAGNOSIS — E1142 Type 2 diabetes mellitus with diabetic polyneuropathy: Secondary | ICD-10-CM | POA: Diagnosis not present

## 2020-09-19 DIAGNOSIS — I10 Essential (primary) hypertension: Secondary | ICD-10-CM | POA: Diagnosis not present

## 2020-09-19 DIAGNOSIS — Z7984 Long term (current) use of oral hypoglycemic drugs: Secondary | ICD-10-CM | POA: Diagnosis not present

## 2020-09-20 DIAGNOSIS — M5416 Radiculopathy, lumbar region: Secondary | ICD-10-CM | POA: Diagnosis not present

## 2020-09-20 DIAGNOSIS — I10 Essential (primary) hypertension: Secondary | ICD-10-CM | POA: Diagnosis not present

## 2020-09-20 DIAGNOSIS — M48061 Spinal stenosis, lumbar region without neurogenic claudication: Secondary | ICD-10-CM | POA: Diagnosis not present

## 2020-10-02 DIAGNOSIS — E211 Secondary hyperparathyroidism, not elsewhere classified: Secondary | ICD-10-CM | POA: Diagnosis not present

## 2020-10-02 DIAGNOSIS — N189 Chronic kidney disease, unspecified: Secondary | ICD-10-CM | POA: Diagnosis not present

## 2020-10-02 DIAGNOSIS — E1122 Type 2 diabetes mellitus with diabetic chronic kidney disease: Secondary | ICD-10-CM | POA: Diagnosis not present

## 2020-10-02 DIAGNOSIS — D472 Monoclonal gammopathy: Secondary | ICD-10-CM | POA: Diagnosis not present

## 2020-10-02 DIAGNOSIS — I129 Hypertensive chronic kidney disease with stage 1 through stage 4 chronic kidney disease, or unspecified chronic kidney disease: Secondary | ICD-10-CM | POA: Diagnosis not present

## 2020-10-03 DIAGNOSIS — D631 Anemia in chronic kidney disease: Secondary | ICD-10-CM | POA: Diagnosis not present

## 2020-10-03 DIAGNOSIS — I129 Hypertensive chronic kidney disease with stage 1 through stage 4 chronic kidney disease, or unspecified chronic kidney disease: Secondary | ICD-10-CM | POA: Diagnosis not present

## 2020-10-03 DIAGNOSIS — D472 Monoclonal gammopathy: Secondary | ICD-10-CM | POA: Diagnosis not present

## 2020-10-03 DIAGNOSIS — E1122 Type 2 diabetes mellitus with diabetic chronic kidney disease: Secondary | ICD-10-CM | POA: Diagnosis not present

## 2020-10-03 DIAGNOSIS — N189 Chronic kidney disease, unspecified: Secondary | ICD-10-CM | POA: Diagnosis not present

## 2020-10-31 DIAGNOSIS — H5203 Hypermetropia, bilateral: Secondary | ICD-10-CM | POA: Diagnosis not present

## 2020-11-20 DIAGNOSIS — M48061 Spinal stenosis, lumbar region without neurogenic claudication: Secondary | ICD-10-CM | POA: Diagnosis not present

## 2020-12-25 DIAGNOSIS — M5416 Radiculopathy, lumbar region: Secondary | ICD-10-CM | POA: Diagnosis not present

## 2021-01-30 ENCOUNTER — Telehealth: Payer: Self-pay | Admitting: Orthopedic Surgery

## 2021-03-04 DIAGNOSIS — E1122 Type 2 diabetes mellitus with diabetic chronic kidney disease: Secondary | ICD-10-CM | POA: Diagnosis not present

## 2021-03-04 DIAGNOSIS — D631 Anemia in chronic kidney disease: Secondary | ICD-10-CM | POA: Diagnosis not present

## 2021-03-04 DIAGNOSIS — I129 Hypertensive chronic kidney disease with stage 1 through stage 4 chronic kidney disease, or unspecified chronic kidney disease: Secondary | ICD-10-CM | POA: Diagnosis not present

## 2021-03-04 DIAGNOSIS — Z5181 Encounter for therapeutic drug level monitoring: Secondary | ICD-10-CM | POA: Diagnosis not present

## 2021-03-04 DIAGNOSIS — Z79899 Other long term (current) drug therapy: Secondary | ICD-10-CM | POA: Diagnosis not present

## 2021-03-04 DIAGNOSIS — N189 Chronic kidney disease, unspecified: Secondary | ICD-10-CM | POA: Diagnosis not present

## 2021-03-19 ENCOUNTER — Telehealth: Payer: Self-pay | Admitting: Orthopedic Surgery

## 2021-03-19 NOTE — Telephone Encounter (Signed)
Patient called - asking if this is the office that has done her epidural injections - relayed is not; tried to assist - said she will get the number and thanked [place].

## 2021-04-02 DIAGNOSIS — E1122 Type 2 diabetes mellitus with diabetic chronic kidney disease: Secondary | ICD-10-CM | POA: Diagnosis not present

## 2021-04-02 DIAGNOSIS — E790 Hyperuricemia without signs of inflammatory arthritis and tophaceous disease: Secondary | ICD-10-CM | POA: Diagnosis not present

## 2021-04-02 DIAGNOSIS — D631 Anemia in chronic kidney disease: Secondary | ICD-10-CM | POA: Diagnosis not present

## 2021-04-02 DIAGNOSIS — N189 Chronic kidney disease, unspecified: Secondary | ICD-10-CM | POA: Diagnosis not present

## 2021-04-02 DIAGNOSIS — I129 Hypertensive chronic kidney disease with stage 1 through stage 4 chronic kidney disease, or unspecified chronic kidney disease: Secondary | ICD-10-CM | POA: Diagnosis not present

## 2021-04-02 DIAGNOSIS — D472 Monoclonal gammopathy: Secondary | ICD-10-CM | POA: Diagnosis not present

## 2021-04-09 DIAGNOSIS — M5416 Radiculopathy, lumbar region: Secondary | ICD-10-CM | POA: Diagnosis not present

## 2021-05-27 ENCOUNTER — Emergency Department (HOSPITAL_COMMUNITY)
Admission: EM | Admit: 2021-05-27 | Discharge: 2021-05-27 | Disposition: A | Discharge status: Home / Self Care | Payer: Medicare Other | Attending: Emergency Medicine | Admitting: Emergency Medicine

## 2021-05-27 ENCOUNTER — Encounter (HOSPITAL_COMMUNITY): Payer: Self-pay | Admitting: *Deleted

## 2021-05-27 ENCOUNTER — Emergency Department (HOSPITAL_COMMUNITY): Payer: Medicare Other

## 2021-05-27 DIAGNOSIS — I10 Essential (primary) hypertension: Secondary | ICD-10-CM | POA: Insufficient documentation

## 2021-05-27 DIAGNOSIS — F17210 Nicotine dependence, cigarettes, uncomplicated: Secondary | ICD-10-CM | POA: Insufficient documentation

## 2021-05-27 DIAGNOSIS — Z7984 Long term (current) use of oral hypoglycemic drugs: Secondary | ICD-10-CM | POA: Diagnosis not present

## 2021-05-27 DIAGNOSIS — E119 Type 2 diabetes mellitus without complications: Secondary | ICD-10-CM | POA: Insufficient documentation

## 2021-05-27 DIAGNOSIS — J45909 Unspecified asthma, uncomplicated: Secondary | ICD-10-CM | POA: Diagnosis not present

## 2021-05-27 DIAGNOSIS — R1030 Lower abdominal pain, unspecified: Secondary | ICD-10-CM | POA: Insufficient documentation

## 2021-05-27 DIAGNOSIS — R109 Unspecified abdominal pain: Secondary | ICD-10-CM | POA: Diagnosis not present

## 2021-05-27 DIAGNOSIS — Z79899 Other long term (current) drug therapy: Secondary | ICD-10-CM | POA: Diagnosis not present

## 2021-05-27 DIAGNOSIS — K921 Melena: Secondary | ICD-10-CM | POA: Diagnosis not present

## 2021-05-27 DIAGNOSIS — R1084 Generalized abdominal pain: Secondary | ICD-10-CM | POA: Diagnosis not present

## 2021-05-27 DIAGNOSIS — R112 Nausea with vomiting, unspecified: Secondary | ICD-10-CM | POA: Diagnosis not present

## 2021-05-27 DIAGNOSIS — R1110 Vomiting, unspecified: Secondary | ICD-10-CM | POA: Diagnosis not present

## 2021-05-27 LAB — URINALYSIS, ROUTINE W REFLEX MICROSCOPIC
Bilirubin Urine: NEGATIVE
Glucose, UA: 50 mg/dL — AB
Ketones, ur: NEGATIVE mg/dL
Leukocytes,Ua: NEGATIVE
Nitrite: NEGATIVE
Protein, ur: 30 mg/dL — AB
Specific Gravity, Urine: 1.021 (ref 1.005–1.030)
pH: 5 (ref 5.0–8.0)

## 2021-05-27 LAB — CBC WITH DIFFERENTIAL/PLATELET
Abs Immature Granulocytes: 0.01 10*3/uL (ref 0.00–0.07)
Basophils Absolute: 0.1 10*3/uL (ref 0.0–0.1)
Basophils Relative: 0 %
Eosinophils Absolute: 0.1 10*3/uL (ref 0.0–0.5)
Eosinophils Relative: 1 %
HCT: 37.6 % (ref 36.0–46.0)
Hemoglobin: 12.2 g/dL (ref 12.0–15.0)
Immature Granulocytes: 0 %
Lymphocytes Relative: 49 %
Lymphs Abs: 5.7 10*3/uL — ABNORMAL HIGH (ref 0.7–4.0)
MCH: 27.6 pg (ref 26.0–34.0)
MCHC: 32.4 g/dL (ref 30.0–36.0)
MCV: 85.1 fL (ref 80.0–100.0)
Monocytes Absolute: 1.1 10*3/uL — ABNORMAL HIGH (ref 0.1–1.0)
Monocytes Relative: 10 %
Neutro Abs: 4.6 10*3/uL (ref 1.7–7.7)
Neutrophils Relative %: 40 %
Platelets: 313 10*3/uL (ref 150–400)
RBC: 4.42 MIL/uL (ref 3.87–5.11)
RDW: 14.8 % (ref 11.5–15.5)
WBC: 11.5 10*3/uL — ABNORMAL HIGH (ref 4.0–10.5)
nRBC: 0 % (ref 0.0–0.2)

## 2021-05-27 LAB — COMPREHENSIVE METABOLIC PANEL
ALT: 16 U/L (ref 0–44)
AST: 16 U/L (ref 15–41)
Albumin: 4.3 g/dL (ref 3.5–5.0)
Alkaline Phosphatase: 59 U/L (ref 38–126)
Anion gap: 8 (ref 5–15)
BUN: 25 mg/dL — ABNORMAL HIGH (ref 8–23)
CO2: 26 mmol/L (ref 22–32)
Calcium: 9.5 mg/dL (ref 8.9–10.3)
Chloride: 104 mmol/L (ref 98–111)
Creatinine, Ser: 1.34 mg/dL — ABNORMAL HIGH (ref 0.44–1.00)
GFR, Estimated: 43 mL/min — ABNORMAL LOW (ref >60)
Glucose, Bld: 117 mg/dL — ABNORMAL HIGH (ref 70–99)
Potassium: 4.2 mmol/L (ref 3.5–5.1)
Sodium: 138 mmol/L (ref 135–145)
Total Bilirubin: 0.5 mg/dL (ref 0.3–1.2)
Total Protein: 8.4 g/dL — ABNORMAL HIGH (ref 6.5–8.1)

## 2021-05-27 LAB — LIPASE, BLOOD: Lipase: 27 U/L (ref 11–51)

## 2021-05-27 LAB — POC OCCULT BLOOD, ED: Fecal Occult Bld: NEGATIVE

## 2021-05-27 MED ORDER — HYDROMORPHONE HCL 1 MG/ML IJ SOLN
0.5000 mg | Freq: Once | INTRAMUSCULAR | Status: AC
  Administered 2021-05-27: 0.5 mg via INTRAVENOUS
  Filled 2021-05-27: qty 1

## 2021-05-27 MED ORDER — PANTOPRAZOLE SODIUM 40 MG IV SOLR
40.0000 mg | Freq: Once | INTRAVENOUS | Status: AC
  Administered 2021-05-27: 40 mg via INTRAVENOUS
  Filled 2021-05-27: qty 40

## 2021-05-27 MED ORDER — SODIUM CHLORIDE 0.9 % IV BOLUS
500.0000 mL | Freq: Once | INTRAVENOUS | Status: AC
  Administered 2021-05-27: 500 mL via INTRAVENOUS

## 2021-05-27 MED ORDER — IOHEXOL 300 MG/ML  SOLN
100.0000 mL | Freq: Once | INTRAMUSCULAR | Status: AC | PRN
  Administered 2021-05-27: 80 mL via INTRAVENOUS

## 2021-05-27 MED ORDER — ONDANSETRON HCL 4 MG/2ML IJ SOLN
4.0000 mg | Freq: Once | INTRAMUSCULAR | Status: AC
  Administered 2021-05-27: 4 mg via INTRAVENOUS
  Filled 2021-05-27: qty 2

## 2021-05-27 MED ORDER — AMOXICILLIN-POT CLAVULANATE 875-125 MG PO TABS: 1.0000 | ORAL_TABLET | Freq: Two times a day (BID) | ORAL | 0 refills | Status: AC

## 2021-05-27 NOTE — ED Notes (Signed)
Noted that patient had left phone in room at discharge, no longer in lobby, sister called at number in chart and message left notifying her (pt previously told this RN that she was staying with her sister to help her with her dialysis treatment)

## 2021-05-27 NOTE — ED Provider Notes (Signed)
[facility] EMERGENCY DEPARTMENT Provider Note   CSN: [id number] Arrival date & time: 05/27/21  1534     History Chief Complaint  Patient presents with   Abdominal Pain    Barbara Wilcox is a 70 y.o. female.  Patient complains of lower abdominal pain.  She saw her primary care doctor and they sent her over here.  Questionable black stools  The history is provided by the patient and medical records. No language interpreter was used.  Abdominal Pain Pain location:  Generalized Pain quality: aching   Pain radiates to:  Does not radiate Pain severity:  Moderate Onset quality:  Sudden Timing:  Constant Progression:  Waxing and waning Chronicity:  New Context: not alcohol use   Relieved by:  Nothing Worsened by:  Nothing Associated symptoms: no chest pain, no cough, no diarrhea, no fatigue and no hematuria       Past Medical History:  Diagnosis Date   Acid reflux    Arthritis    Asthma    Bronchitis    Bursitis    Diabetes mellitus with retinopathy ([place])    Hypertension     Patient Active Problem List   Diagnosis Date Noted   E coli bacteremia    Sepsis secondary to UTI ([place]) 12/24/2018   Acute renal failure superimposed on stage 3 chronic kidney disease ([place]) 12/24/2018   Influenza A 08/08/2018   Hypertension    Type 2 diabetes with nephropathy ([facility])    Epistaxis 12/12/2014   TOBACCO USER 05/02/2007   Asthma 03/21/2007   GERD 03/21/2007   MENOPAUSE-RELATED VASOMOTOR SYMPTOMS, HOT FLASHES 03/21/2007    Past Surgical History:  Procedure Laterality Date   ABDOMINAL HYSTERECTOMY     TOOTH EXTRACTION       OB History     Gravida  1   Para  1   Term  1   Preterm      AB      Living         SAB      IAB      Ectopic      Multiple      Live Births              Family History  Problem Relation Age of Onset   Chronic Renal Failure Sister        on HD   Breast cancer Sister    Bone cancer Brother     Social History    Tobacco Use   Smoking status: Every Day    Packs/day: 0.50    Years: 36.00    Pack years: 18.00    Types: Cigarettes   Smokeless tobacco: Never  Vaping Use   Vaping Use: Never used  Substance Use Topics   Alcohol use: Yes    Comment: rare   Drug use: Yes    Types: Marijuana    Comment: occasionally; last use a while ago    Home Medications Prior to Admission medications   Medication Sig Start Date End Date Taking? Authorizing Provider  amoxicillin-clavulanate (AUGMENTIN) 875-125 MG tablet Take 1 tablet by mouth every 12 (twelve) hours. 05/27/21  Yes Milton Ferguson, MD  acetaminophen-codeine (TYLENOL #3) 300-30 MG tablet Take 1 tablet by mouth every 6 (six) hours as needed for moderate pain. 06/19/19   Carole Civil, MD  albuterol (PROVENTIL HFA;VENTOLIN HFA) 108 (90 Base) MCG/ACT inhaler Inhale 2 puffs into the lungs every 6 (six) hours as needed. For shortness of breath. 08/09/18  Roxan Hockey, MD  benzonatate (TESSALON) 100 MG capsule Take 1 capsule (100 mg total) by mouth every 8 (eight) hours. 07/24/19   Avegno, Darrelyn Hillock, FNP  cephALEXin (KEFLEX) 250 MG capsule Take 1 capsule (250 mg total) by mouth 4 (four) times daily. 07/24/19   Avegno, Darrelyn Hillock, FNP  citalopram (CELEXA) 40 MG tablet Take 40 mg by mouth daily. 12/28/18   [provider]  fluticasone (FLONASE) 50 MCG/ACT nasal spray Place 1 spray into both nostrils daily for 14 days. 07/24/19 08/07/19  Avegno, Darrelyn Hillock, FNP  gabapentin (NEURONTIN) 300 MG capsule Take 1 capsule (300 mg total) by mouth 3 (three) times daily. 07/04/19   Carole Civil, MD  HYDROcodone-acetaminophen (NORCO/VICODIN) 5-325 MG tablet Take 1 tablet by mouth every 6 (six) hours as needed for moderate pain. 07/04/19   Carole Civil, MD  lisinopril-hydrochlorothiazide (PRINZIDE,ZESTORETIC) 10-12.5 MG tablet Take 1 tablet by mouth daily. 03/16/18   [provider]  loratadine (CLARITIN) 10 MG tablet Take 10 mg by mouth  daily. 12/28/18   [provider]  meloxicam (MOBIC) 7.5 MG tablet TAKE 1 TABLET BY MOUTH EVERY DAY 01/30/21   Sanjuana Kava, MD  metFORMIN (GLUCOPHAGE) 500 MG tablet Take 250 mg by mouth daily with breakfast.    [provider]  montelukast (SINGULAIR) 10 MG tablet Take 10 mg by mouth daily. 12/28/18   [provider]  nitrofurantoin, macrocrystal-monohydrate, (MACROBID) 100 MG capsule TAKE 1 CAPSULE 2 TIMES A DAY FOR UTI 01/17/19   [provider]  pantoprazole (PROTONIX) 40 MG tablet TAKE 1 ORAL TABLET ONCE A DAY FOR GERD. 12/28/18   [provider]  pravastatin (PRAVACHOL) 10 MG tablet Take 10 mg by mouth daily. 12/28/18   [provider]  tiZANidine (ZANAFLEX) 4 MG tablet Take 1 tablet (4 mg total) by mouth every 6 (six) hours as needed for muscle spasms. 07/04/19   Carole Civil, MD  traZODone (DESYREL) 50 MG tablet Take 50 mg by mouth at bedtime.    [provider]    Allergies    Aspirin  Review of Systems   Review of Systems  Constitutional:  Negative for appetite change and fatigue.  HENT:  Negative for congestion, ear discharge and sinus pressure.   Eyes:  Negative for discharge.  Respiratory:  Negative for cough.   Cardiovascular:  Negative for chest pain.  Gastrointestinal:  Positive for abdominal pain. Negative for diarrhea.  Genitourinary:  Negative for frequency and hematuria.  Musculoskeletal:  Negative for back pain.  Skin:  Negative for rash.  Neurological:  Negative for seizures and headaches.  Psychiatric/Behavioral:  Negative for hallucinations.    Physical Exam Updated Vital Signs BP 108/74   Pulse 74   Temp 99.1 F (37.3 C) (Oral)   Resp 20   Wt 69.9 kg   SpO2 99%   BMI 27.30 kg/m   Physical Exam Vitals and nursing note reviewed.  Constitutional:      Appearance: She is well-developed.  HENT:     Head: Normocephalic.     Nose: Nose normal.  Eyes:     General: No scleral icterus.     Conjunctiva/sclera: Conjunctivae normal.  Neck:     Thyroid: No thyromegaly.  Cardiovascular:     Rate and Rhythm: Normal rate and regular rhythm.     Heart sounds: No murmur heard.   No friction rub. No gallop.  Pulmonary:     Breath sounds: No stridor. No wheezing or rales.  Chest:     Chest wall: No tenderness.  Abdominal:     General: There is no distension.     Tenderness: There is abdominal tenderness. There is no rebound.     Comments: Tender suprapubic  Musculoskeletal:        General: Normal range of motion.     Cervical back: Neck supple.  Lymphadenopathy:     Cervical: No cervical adenopathy.  Skin:    Findings: No erythema or rash.  Neurological:     Mental Status: She is alert and oriented to person, place, and time.     Motor: No abnormal muscle tone.     Coordination: Coordination normal.  Psychiatric:        Behavior: Behavior normal.    ED Results / Procedures / Treatments   Labs (all labs ordered are listed, but only abnormal results are displayed) Labs Reviewed  CBC WITH DIFFERENTIAL/PLATELET - Abnormal; Notable for the following components:      Result Value   WBC 11.5 (*)    Lymphs Abs 5.7 (*)    Monocytes Absolute 1.1 (*)    All other components within normal limits  COMPREHENSIVE METABOLIC PANEL - Abnormal; Notable for the following components:   Glucose, Bld 117 (*)    BUN 25 (*)    Creatinine, Ser 1.34 (*)    Total Protein 8.4 (*)    GFR, Estimated 43 (*)    All other components within normal limits  URINALYSIS, ROUTINE W REFLEX MICROSCOPIC - Abnormal; Notable for the following components:   APPearance HAZY (*)    Glucose, UA 50 (*)    Hgb urine dipstick MODERATE (*)    Protein, ur 30 (*)    Bacteria, UA RARE (*)    All other components within normal limits  URINE CULTURE  LIPASE, BLOOD  POC OCCULT BLOOD, ED    EKG None  Radiology CT ABDOMEN PELVIS W CONTRAST  Result Date: 05/27/2021 CLINICAL DATA:  Abdominal pain with nausea  and vomiting. Patient reports black stool. EXAM: CT ABDOMEN AND PELVIS WITH CONTRAST TECHNIQUE: Multidetector CT imaging of the abdomen and pelvis was performed using the standard protocol following bolus administration of intravenous contrast. CONTRAST:  17mL OMNIPAQUE IOHEXOL 300 MG/ML  SOLN COMPARISON:  12/24/2018 FINDINGS: Lower chest: No acute airspace disease or pleural effusion. Normal heart size. Hepatobiliary: Minimal focal fatty infiltration adjacent to the falciform ligament. Punctate hepatic granuloma. No suspicious liver lesion. Gallbladder physiologically distended, no calcified stone. No biliary dilatation. Pancreas: No ductal dilatation or inflammation. Spleen: Normal in size without focal abnormality. Small splenule anteriorly. Adrenals/Urinary Tract: No adrenal nodule. There right kidney is slightly malrotated and located in the lower abdomen/upper pelvis. There is similar prominence of the right renal collecting system. Cortical scarring in the upper left kidney. There is slight heterogeneous enhancement of the left kidney. No visualized renal calculi or focal renal lesion. Urinary bladder is near completely empty and not well assessed. Stomach/Bowel: The stomach is decompressed. There is no small bowel obstruction or evidence of inflammatory change. Occasional fluid-filled loops of small bowel that are not abnormally dilated. Normal appendix. Formed stool in the ascending and transverse colon. Small volume of stool in the distal colon. Sigmoid colon is mildly redundant. There is no colonic inflammation. Possible minimal sigmoid diverticulosis but no diverticulitis. Vascular/Lymphatic: Aortic atherosclerosis. No aortic aneurysm. Patent portal vein. No abdominopelvic adenopathy. Circumaortic left renal vein. Reproductive: Status post hysterectomy. No adnexal masses. Other: No free air, free fluid, or intra-abdominal fluid  collection. Musculoskeletal: L5-S1 facet hypertrophy. There are no acute or  suspicious osseous abnormalities. IMPRESSION: 1. Slightly heterogeneous enhancement of the left kidney, can be seen with pyelonephritis. Recommend correlation with urinalysis. 2. 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 3. Possible minimal sigmoid diverticulosis without diverticulitis. Aortic Atherosclerosis (ICD10-I70.0). Electronically Signed   By: Keith Rake M.D.   On: 05/27/2021 17:49    Procedures Procedures   Medications Ordered in ED Medications  HYDROmorphone (DILAUDID) injection 0.5 mg (0.5 mg Intravenous Given 05/27/21 1645)  ondansetron (ZOFRAN) injection 4 mg (4 mg Intravenous Given 05/27/21 1646)  sodium chloride 0.9 % bolus 500 mL (0 mLs Intravenous Stopped 05/27/21 1715)  pantoprazole (PROTONIX) injection 40 mg (40 mg Intravenous Given 05/27/21 1646)  iohexol (OMNIPAQUE) 300 MG/ML solution 100 mL (80 mLs Intravenous Contrast Given 05/27/21 1733)    ED Course  I have reviewed the triage vital signs and the nursing notes.  Pertinent labs & imaging results that were available during my care of the patient were reviewed by me and considered in my medical decision making (see chart for details). Labs unremarkable except for minimally elevated white count.  CT scan is suggestive of urinary tract infection.  We will get cultures and empirically treat her with Augmentin and have her follow-up with her PCP.  Patient is feeling much better   MDM Rules/Calculators/A&P                           Abdominal pain with possible urinary tract infection.  Cultures done and patient placed on Augmentin Final Clinical Impression(s) / ED Diagnoses Final diagnoses:  Lower abdominal pain    Rx / DC Orders ED Discharge Orders          Ordered    amoxicillin-clavulanate (AUGMENTIN) 875-125 MG tablet  Every 12 hours        05/27/21 1852             Milton Ferguson, MD 05/27/21 1856

## 2021-05-27 NOTE — ED Notes (Signed)
Transported to CT 

## 2021-05-27 NOTE — ED Triage Notes (Signed)
Abdominal pain with black stools for 2 days

## 2021-05-27 NOTE — Discharge Instructions (Signed)
Drink plenty of fluids.  Take Tylenol or Motrin for pain.  Follow-up with your family doctor the end of this week or the beginning of next week and they can check on her urine culture to see if need to continue taking the Augmentin

## 2021-05-29 LAB — URINE CULTURE

## 2021-05-30 DIAGNOSIS — K219 Gastro-esophageal reflux disease without esophagitis: Secondary | ICD-10-CM | POA: Diagnosis not present

## 2021-05-30 DIAGNOSIS — N390 Urinary tract infection, site not specified: Secondary | ICD-10-CM | POA: Diagnosis not present

## 2021-05-30 DIAGNOSIS — G894 Chronic pain syndrome: Secondary | ICD-10-CM | POA: Diagnosis not present

## 2021-05-30 DIAGNOSIS — M25562 Pain in left knee: Secondary | ICD-10-CM | POA: Diagnosis not present

## 2021-07-25 ENCOUNTER — Telehealth: Payer: Self-pay | Admitting: Orthopaedic Surgery

## 2022-01-06 ENCOUNTER — Other Ambulatory Visit: Payer: Self-pay | Admitting: Family Medicine

## 2022-01-06 DIAGNOSIS — Z1231 Encounter for screening mammogram for malignant neoplasm of breast: Secondary | ICD-10-CM

## 2022-01-14 ENCOUNTER — Ambulatory Visit
Admission: RE | Admit: 2022-01-14 | Discharge: 2022-01-14 | Disposition: A | Discharge status: Home / Self Care | Payer: 59 | Source: Ambulatory Visit | Attending: Family Medicine | Admitting: Family Medicine

## 2022-01-14 DIAGNOSIS — Z1231 Encounter for screening mammogram for malignant neoplasm of breast: Secondary | ICD-10-CM

## 2022-01-28 ENCOUNTER — Ambulatory Visit: Payer: 59

## 2022-02-04 ENCOUNTER — Ambulatory Visit: Payer: 59

## 2022-06-02 ENCOUNTER — Other Ambulatory Visit (HOSPITAL_BASED_OUTPATIENT_CLINIC_OR_DEPARTMENT_OTHER): Payer: Self-pay | Admitting: Family Medicine

## 2022-06-02 DIAGNOSIS — M79605 Pain in left leg: Secondary | ICD-10-CM

## 2022-06-02 DIAGNOSIS — M79604 Pain in right leg: Secondary | ICD-10-CM

## 2022-06-03 ENCOUNTER — Other Ambulatory Visit (HOSPITAL_BASED_OUTPATIENT_CLINIC_OR_DEPARTMENT_OTHER): Payer: Self-pay | Admitting: Family Medicine

## 2022-06-03 ENCOUNTER — Other Ambulatory Visit: Payer: Self-pay | Admitting: Family Medicine

## 2022-06-03 ENCOUNTER — Ambulatory Visit (HOSPITAL_BASED_OUTPATIENT_CLINIC_OR_DEPARTMENT_OTHER)
Admission: RE | Admit: 2022-06-03 | Discharge: 2022-06-03 | Disposition: A | Discharge status: Home / Self Care | Payer: Medicare Other | Source: Ambulatory Visit | Attending: Family Medicine | Admitting: Family Medicine

## 2022-06-03 DIAGNOSIS — Z1231 Encounter for screening mammogram for malignant neoplasm of breast: Secondary | ICD-10-CM | POA: Insufficient documentation

## 2022-06-03 DIAGNOSIS — M79605 Pain in left leg: Secondary | ICD-10-CM | POA: Insufficient documentation

## 2022-06-03 DIAGNOSIS — M79604 Pain in right leg: Secondary | ICD-10-CM | POA: Insufficient documentation

## 2022-06-03 DIAGNOSIS — E2839 Other primary ovarian failure: Secondary | ICD-10-CM

## 2022-08-25 ENCOUNTER — Telehealth: Payer: Self-pay | Admitting: Orthopaedic Surgery

## 2022-12-15 ENCOUNTER — Telehealth: Payer: Self-pay

## 2022-12-15 NOTE — Telephone Encounter (Signed)
Fax recieved

## 2022-12-16 MED ORDER — MELOXICAM 7.5 MG PO TABS: 7.5000 mg | ORAL_TABLET | Freq: Every day | ORAL | 1 refills | Status: DC

## 2023-01-07 ENCOUNTER — Other Ambulatory Visit: Payer: Self-pay | Admitting: Family Medicine

## 2023-01-07 ENCOUNTER — Ambulatory Visit
Admission: RE | Admit: 2023-01-07 | Discharge: 2023-01-07 | Disposition: A | Discharge status: Home / Self Care | Payer: 59 | Source: Ambulatory Visit | Attending: Family Medicine | Admitting: Family Medicine

## 2023-01-07 DIAGNOSIS — M79671 Pain in right foot: Secondary | ICD-10-CM

## 2023-01-07 DIAGNOSIS — M25571 Pain in right ankle and joints of right foot: Secondary | ICD-10-CM

## 2023-02-23 ENCOUNTER — Telehealth: Payer: Self-pay | Admitting: Orthopaedic Surgery

## 2023-06-28 ENCOUNTER — Other Ambulatory Visit: Payer: Self-pay | Admitting: Family Medicine

## 2023-06-28 DIAGNOSIS — M545 Low back pain, unspecified: Secondary | ICD-10-CM

## 2023-07-05 ENCOUNTER — Ambulatory Visit
Admission: RE | Admit: 2023-07-05 | Discharge: 2023-07-05 | Disposition: A | Discharge status: Home / Self Care | Payer: 59 | Source: Ambulatory Visit | Attending: Family Medicine | Admitting: Family Medicine

## 2023-07-05 DIAGNOSIS — M545 Low back pain, unspecified: Secondary | ICD-10-CM

## 2023-10-13 ENCOUNTER — Other Ambulatory Visit: Payer: Self-pay | Admitting: Family Medicine

## 2023-10-13 ENCOUNTER — Ambulatory Visit
Admission: RE | Admit: 2023-10-13 | Discharge: 2023-10-13 | Disposition: A | Discharge status: Home / Self Care | Source: Ambulatory Visit | Attending: Family Medicine | Admitting: Family Medicine

## 2023-10-13 DIAGNOSIS — M069 Rheumatoid arthritis, unspecified: Secondary | ICD-10-CM

## 2023-10-15 ENCOUNTER — Other Ambulatory Visit: Payer: Self-pay | Admitting: Orthopaedic Surgery

## 2023-11-24 ENCOUNTER — Other Ambulatory Visit: Payer: Self-pay

## 2023-11-24 ENCOUNTER — Ambulatory Visit (HOSPITAL_COMMUNITY)
Admission: EM | Admit: 2023-11-24 | Discharge: 2023-11-24 | Disposition: A | Discharge status: Home / Self Care | Attending: Emergency Medicine | Admitting: Emergency Medicine

## 2023-11-24 ENCOUNTER — Encounter (HOSPITAL_COMMUNITY): Payer: Self-pay | Admitting: Emergency Medicine

## 2023-11-24 DIAGNOSIS — M79641 Pain in right hand: Secondary | ICD-10-CM | POA: Diagnosis not present

## 2023-11-24 DIAGNOSIS — G8929 Other chronic pain: Secondary | ICD-10-CM

## 2023-11-24 DIAGNOSIS — M1990 Unspecified osteoarthritis, unspecified site: Secondary | ICD-10-CM | POA: Diagnosis not present

## 2023-11-24 DIAGNOSIS — M79642 Pain in left hand: Secondary | ICD-10-CM

## 2023-11-24 MED ORDER — CELECOXIB 50 MG PO CAPS: 50.0000 mg | ORAL_CAPSULE | Freq: Two times a day (BID) | ORAL | 0 refills | Status: AC

## 2023-11-24 NOTE — Discharge Instructions (Addendum)
 Start taking Celebrex twice daily as needed for pain.  Do not take this other NSAIDs including Advil , ibuprofen , Motrin , Aleve, naproxen, and meloxicam . You can continue to take 500 mg of Tylenol  every 6-8 hours as needed for breakthrough pain. Do not exceed 4000 mg in a day.  You can also continue to wear your brace as needed.  Alternate between ice and heat as needed for pain. You can follow-up with [facility] as needed or your orthopedic doctor that you have seen in the past. Follow-up with primary care provider as needed. Return here as needed.

## 2023-11-24 NOTE — ED Triage Notes (Signed)
 Pt c/o bilateral hand pain for the past 2 months, states her PCP diagnosed her with arthritis and suggested her to come to [facility] for pain pills.

## 2023-11-24 NOTE — ED Provider Notes (Addendum)
 [facility]    CSN: [id number] Arrival date & time: 11/24/23  1001      History   Chief Complaint Chief Complaint  Patient presents with   Hand Pain    HPI Barbara Wilcox is a 73 y.o. female.   Patient presents with chronic bilateral hand pain and stiffness x 2 months.  Patient states the pain in her left hand has become so severe that she has difficulty moving her hand.  Patient is currently wearing a brace which she states provides her more comfort.  Patient reports that she takes hydrocodone  for chronic pain in her legs, but states that this is not providing her any relief.  Patient states that her primary care doctor told her that if she ever experiences pain from her arthritis she can come to the urgent care for "pain pills".  Patient states that she saw her primary care provider 2 weeks ago and they did not prescribe her anything for pain at that time.  Patient reports taking meloxicam  in the past with some relief, but reports running out of this recently.  The history is provided by the patient and medical records.  Hand Pain    Past Medical History:  Diagnosis Date   Acid reflux    Arthritis    Asthma    Bronchitis    Bursitis    Diabetes mellitus with retinopathy ([facility])    Hypertension     Patient Active Problem List   Diagnosis Date Noted   E coli bacteremia    Sepsis secondary to UTI ([facility]) 12/24/2018   Acute renal failure superimposed on stage 3 chronic kidney disease ([facility]) 12/24/2018   Influenza A 08/08/2018   Hypertension    Type 2 diabetes with nephropathy ([facility])    Epistaxis 12/12/2014   TOBACCO USER 05/02/2007   Asthma 03/21/2007   GERD 03/21/2007   MENOPAUSE-RELATED VASOMOTOR SYMPTOMS, HOT FLASHES 03/21/2007    Past Surgical History:  Procedure Laterality Date   ABDOMINAL HYSTERECTOMY     TOOTH EXTRACTION      OB History     Gravida  1   Para  1   Term  1   Preterm      AB      Living         SAB      IAB       Ectopic      Multiple      Live Births               Home Medications    Prior to Admission medications   Medication Sig Start Date End Date Taking? Authorizing Provider  celecoxib (CELEBREX) 50 MG capsule Take 1 capsule (50 mg total) by mouth 2 (two) times daily. 11/24/23  Yes Levora Reas A, NP  acetaminophen -codeine  (TYLENOL  #3) 300-30 MG tablet Take 1 tablet by mouth every 6 (six) hours as needed for moderate pain. 06/19/19   Darrin Emerald, MD  albuterol  (PROVENTIL  HFA;VENTOLIN  HFA) 108 (90 Base) MCG/ACT inhaler Inhale 2 puffs into the lungs every 6 (six) hours as needed. For shortness of breath. 08/09/18   Colin Dawley, MD  amoxicillin -clavulanate (AUGMENTIN ) 875-125 MG tablet Take 1 tablet by mouth every 12 (twelve) hours. 05/27/21   Cheyenne Cotta, MD  benzonatate  (TESSALON ) 100 MG capsule Take 1 capsule (100 mg total) by mouth every 8 (eight) hours. 07/24/19   Avegno, Komlanvi S, FNP  cephALEXin  (KEFLEX ) 250 MG capsule Take 1 capsule (250 mg total)  by mouth 4 (four) times daily. 07/24/19   Avegno, Komlanvi S, FNP  citalopram (CELEXA) 40 MG tablet Take 40 mg by mouth daily. 12/28/18   [provider]  fluticasone  (FLONASE ) 50 MCG/ACT nasal spray Place 1 spray into both nostrils daily for 14 days. 07/24/19 08/07/19  Avegno, Komlanvi S, FNP  gabapentin  (NEURONTIN ) 300 MG capsule Take 1 capsule (300 mg total) by mouth 3 (three) times daily. 07/04/19   Darrin Emerald, MD  HYDROcodone -acetaminophen  (NORCO/VICODIN) 5-325 MG tablet Take 1 tablet by mouth every 6 (six) hours as needed for moderate pain. 07/04/19   Darrin Emerald, MD  lisinopril-hydrochlorothiazide (PRINZIDE,ZESTORETIC) 10-12.5 MG tablet Take 1 tablet by mouth daily. 03/16/18   [provider]  loratadine  (CLARITIN ) 10 MG tablet Take 10 mg by mouth daily. 12/28/18   [provider]  metFORMIN (GLUCOPHAGE) 500 MG tablet Take 250 mg by mouth daily with breakfast.    [provider]  montelukast (SINGULAIR) 10 MG tablet Take 10 mg by mouth daily. 12/28/18   [provider]  nitrofurantoin , macrocrystal-monohydrate, (MACROBID ) 100 MG capsule TAKE 1 CAPSULE 2 TIMES A DAY FOR UTI 01/17/19   [provider]  pantoprazole  (PROTONIX ) 40 MG tablet TAKE 1 ORAL TABLET ONCE A DAY FOR GERD. 12/28/18   [provider]  pravastatin (PRAVACHOL) 10 MG tablet Take 10 mg by mouth daily. 12/28/18   [provider]  tiZANidine  (ZANAFLEX ) 4 MG tablet Take 1 tablet (4 mg total) by mouth every 6 (six) hours as needed for muscle spasms. 07/04/19   Darrin Emerald, MD  traZODone  (DESYREL ) 50 MG tablet Take 50 mg by mouth at bedtime.    [provider]    Family History Family History  Problem Relation Age of Onset   Chronic Renal Failure Sister        on HD   Breast cancer Sister    Bone cancer Brother     Social History Social History   Tobacco Use   Smoking status: Every Day    Current packs/day: 0.50    Average packs/day: 0.5 packs/day for 36.0 years (18.0 ttl pk-yrs)    Types: Cigarettes   Smokeless tobacco: Never  Vaping Use   Vaping status: Never Used  Substance Use Topics   Alcohol use: Yes    Comment: rare   Drug use: Yes    Types: Marijuana    Comment: occasionally; last use a while ago     Allergies   Aspirin   Review of Systems Review of Systems  Per HPI  Physical Exam Triage Vital Signs ED Triage Vitals  Encounter Vitals Group     BP 11/24/23 1014 137/67     Systolic BP Percentile --      Diastolic BP Percentile --      Pulse Rate 11/24/23 1012 98     Resp 11/24/23 1012 18     Temp 11/24/23 1012 98.2 F (36.8 C)     Temp Source 11/24/23 1012 Oral     SpO2 11/24/23 1012 98 %     Weight --      Height --      Head Circumference --      Peak Flow --      Pain Score 11/24/23 1013 8     Pain Loc --      Pain Education --      Exclude from Growth Chart --    No data found.  Updated  Vital Signs  BP 137/67 (BP Location: Right Arm)   Pulse 98   Temp 98.2 F (36.8 C) (Oral)   Resp 18   SpO2 98%   Visual Acuity Right Eye Distance:   Left Eye Distance:   Bilateral Distance:    Right Eye Near:   Left Eye Near:    Bilateral Near:     Physical Exam Vitals and nursing note reviewed.  Constitutional:      General: She is awake. She is not in acute distress.    Appearance: Normal appearance. She is well-developed and well-groomed. She is not ill-appearing.  Musculoskeletal:     Right hand: Tenderness present. No swelling or deformity. Normal range of motion.     Left hand: Tenderness present. No swelling or deformity. Decreased range of motion.  Skin:    General: Skin is warm and dry.  Neurological:     Mental Status: She is alert.  Psychiatric:        Behavior: Behavior is cooperative.      UC Treatments / Results  Labs (all labs ordered are listed, but only abnormal results are displayed) Labs Reviewed - No data to display  EKG   Radiology No results found.  Procedures Procedures (including critical care time)  Medications Ordered in UC Medications - No data to display  Initial Impression / Assessment and Plan / UC Course  I have reviewed the triage vital signs and the nursing notes.  Pertinent labs & imaging results that were available during my care of the patient were reviewed by me and considered in my medical decision making (see chart for details).     Patient is well-appearing.  Vitals are stable.  Upon assessment tenderness noted to all interphalangeal joints of bilateral hands.  Mildly decreased range of motion noted to the left interphalangeal joints due to pain.  Without any swelling present.  Prescribed low-dose Celebrex for arthritis related pain due to history of CKD.  Recommended taking Tylenol  as needed for breakthrough pain.  Given orthopedic information to follow-up with if needed.  Discussed follow-up and return  precautions. Final Clinical Impressions(s) / UC Diagnoses   Final diagnoses:  Chronic hand pain, left  Chronic hand pain, right  Arthritis     Discharge Instructions      Start taking Celebrex twice daily as needed for pain.  Do not take this other NSAIDs including Advil , ibuprofen , Motrin , Aleve, naproxen, and meloxicam . You can continue to take 500 mg of Tylenol  every 6-8 hours as needed for breakthrough pain. Do not exceed 4000 mg in a day.  You can also continue to wear your brace as needed.  Alternate between ice and heat as needed for pain. You can follow-up with [facility] as needed or your orthopedic doctor that you have seen in the past. Follow-up with primary care provider as needed. Return here as needed.    ED Prescriptions     Medication Sig Dispense Auth. Provider   celecoxib (CELEBREX) 50 MG capsule Take 1 capsule (50 mg total) by mouth 2 (two) times daily. 30 capsule Levora Reas A, NP      PDMP not reviewed this encounter.   Karon Packer, NP 11/24/23 1032    Levora Reas A, [place] 11/24/23 [phone]
